# Patient Record
Sex: Male | Born: 1950 | Race: White | Hispanic: No | Marital: Single | Smoking: Current every day smoker
Health system: Southern US, Community
[De-identification: ages and names within clinical notes are randomized; demographics above are authoritative.]

## PROBLEM LIST (undated history)

## (undated) DIAGNOSIS — R42 Dizziness and giddiness: Secondary | ICD-10-CM

## (undated) DIAGNOSIS — M25511 Pain in right shoulder: Secondary | ICD-10-CM

## (undated) DIAGNOSIS — G839 Paralytic syndrome, unspecified: Secondary | ICD-10-CM

## (undated) DIAGNOSIS — F329 Major depressive disorder, single episode, unspecified: Secondary | ICD-10-CM

## (undated) DIAGNOSIS — F1010 Alcohol abuse, uncomplicated: Secondary | ICD-10-CM

## (undated) DIAGNOSIS — S31119A Laceration without foreign body of abdominal wall, unspecified quadrant without penetration into peritoneal cavity, initial encounter: Secondary | ICD-10-CM

## (undated) DIAGNOSIS — I10 Essential (primary) hypertension: Secondary | ICD-10-CM

## (undated) DIAGNOSIS — F32A Depression, unspecified: Secondary | ICD-10-CM

## (undated) HISTORY — PX: HERNIA REPAIR: SHX51

---

## 2002-03-23 ENCOUNTER — Encounter: Payer: Self-pay | Admitting: Emergency Medicine

## 2002-03-23 ENCOUNTER — Emergency Department (HOSPITAL_COMMUNITY): Admission: EM | Admit: 2002-03-23 | Discharge: 2002-03-23 | Payer: Self-pay | Admitting: *Deleted

## 2002-03-24 ENCOUNTER — Emergency Department (HOSPITAL_COMMUNITY): Admission: EM | Admit: 2002-03-24 | Discharge: 2002-03-24 | Payer: Self-pay | Admitting: Emergency Medicine

## 2015-08-16 DIAGNOSIS — S42309A Unspecified fracture of shaft of humerus, unspecified arm, initial encounter for closed fracture: Secondary | ICD-10-CM | POA: Insufficient documentation

## 2015-10-17 ENCOUNTER — Emergency Department (HOSPITAL_COMMUNITY)
Admission: EM | Admit: 2015-10-17 | Discharge: 2015-10-18 | Disposition: A | Discharge status: Home / Self Care | Payer: Medicaid Other | Attending: Emergency Medicine | Admitting: Emergency Medicine

## 2015-10-17 ENCOUNTER — Encounter (HOSPITAL_COMMUNITY): Payer: Self-pay | Admitting: Emergency Medicine

## 2015-10-17 DIAGNOSIS — F4324 Adjustment disorder with disturbance of conduct: Secondary | ICD-10-CM | POA: Diagnosis present

## 2015-10-17 DIAGNOSIS — Y998 Other external cause status: Secondary | ICD-10-CM | POA: Diagnosis not present

## 2015-10-17 DIAGNOSIS — Z008 Encounter for other general examination: Secondary | ICD-10-CM | POA: Diagnosis present

## 2015-10-17 DIAGNOSIS — T421X1A Poisoning by iminostilbenes, accidental (unintentional), initial encounter: Secondary | ICD-10-CM | POA: Insufficient documentation

## 2015-10-17 DIAGNOSIS — F1210 Cannabis abuse, uncomplicated: Secondary | ICD-10-CM | POA: Insufficient documentation

## 2015-10-17 DIAGNOSIS — Y9289 Other specified places as the place of occurrence of the external cause: Secondary | ICD-10-CM | POA: Diagnosis not present

## 2015-10-17 DIAGNOSIS — M25511 Pain in right shoulder: Secondary | ICD-10-CM | POA: Diagnosis not present

## 2015-10-17 DIAGNOSIS — F1410 Cocaine abuse, uncomplicated: Secondary | ICD-10-CM | POA: Insufficient documentation

## 2015-10-17 DIAGNOSIS — F329 Major depressive disorder, single episode, unspecified: Secondary | ICD-10-CM | POA: Diagnosis not present

## 2015-10-17 DIAGNOSIS — F17200 Nicotine dependence, unspecified, uncomplicated: Secondary | ICD-10-CM | POA: Diagnosis not present

## 2015-10-17 DIAGNOSIS — Z8669 Personal history of other diseases of the nervous system and sense organs: Secondary | ICD-10-CM | POA: Diagnosis not present

## 2015-10-17 DIAGNOSIS — Y9389 Activity, other specified: Secondary | ICD-10-CM | POA: Insufficient documentation

## 2015-10-17 DIAGNOSIS — Z8781 Personal history of (healed) traumatic fracture: Secondary | ICD-10-CM | POA: Diagnosis not present

## 2015-10-17 DIAGNOSIS — F1910 Other psychoactive substance abuse, uncomplicated: Secondary | ICD-10-CM

## 2015-10-17 DIAGNOSIS — Z046 Encounter for general psychiatric examination, requested by authority: Secondary | ICD-10-CM

## 2015-10-17 HISTORY — DX: Paralytic syndrome, unspecified: G83.9

## 2015-10-17 LAB — COMPREHENSIVE METABOLIC PANEL
ALT: 14 U/L — AB (ref 17–63)
ANION GAP: 12 (ref 5–15)
AST: 23 U/L (ref 15–41)
Albumin: 3.9 g/dL (ref 3.5–5.0)
Alkaline Phosphatase: 129 U/L — ABNORMAL HIGH (ref 38–126)
BUN: 9 mg/dL (ref 6–20)
CHLORIDE: 103 mmol/L (ref 101–111)
CO2: 22 mmol/L (ref 22–32)
Calcium: 8.7 mg/dL — ABNORMAL LOW (ref 8.9–10.3)
Creatinine, Ser: 0.75 mg/dL (ref 0.61–1.24)
GFR calc non Af Amer: >60 mL/min (ref >60)
Glucose, Bld: 180 mg/dL — ABNORMAL HIGH (ref 65–99)
POTASSIUM: 3.8 mmol/L (ref 3.5–5.1)
SODIUM: 137 mmol/L (ref 135–145)
Total Bilirubin: 0.5 mg/dL (ref 0.3–1.2)
Total Protein: 6.9 g/dL (ref 6.5–8.1)

## 2015-10-17 LAB — CBC
HCT: 41.3 % (ref 39.0–52.0)
HEMOGLOBIN: 13.4 g/dL (ref 13.0–17.0)
MCH: 30 pg (ref 26.0–34.0)
MCHC: 32.4 g/dL (ref 30.0–36.0)
MCV: 92.6 fL (ref 78.0–100.0)
Platelets: 239 10*3/uL (ref 150–400)
RBC: 4.46 MIL/uL (ref 4.22–5.81)
RDW: 15.6 % — ABNORMAL HIGH (ref 11.5–15.5)
WBC: 7.2 10*3/uL (ref 4.0–10.5)

## 2015-10-17 LAB — ETHANOL: Alcohol, Ethyl (B): <5 mg/dL (ref <5)

## 2015-10-17 LAB — CBG MONITORING, ED: GLUCOSE-CAPILLARY: 160 mg/dL — AB (ref 65–99)

## 2015-10-17 LAB — ACETAMINOPHEN LEVEL

## 2015-10-17 LAB — SALICYLATE LEVEL

## 2015-10-17 MED ORDER — ONDANSETRON 8 MG PO TBDP
8.0000 mg | ORAL_TABLET | Freq: Once | ORAL | Status: AC
  Administered 2015-10-18: 8 mg via ORAL
  Filled 2015-10-17: qty 1

## 2015-10-17 NOTE — ED Notes (Addendum)
Pt BIB [facility] after being IVC'd tonight by his substance abuse counselor. Pt states he took (5)  tegretol and per IVC paperwork has been consuming alcohol in excess. Pt denies drinking alcohol. Petitioner thinks pt has possibly taken these pills in a suicide attempt. Alert and oriented. Vomiting.

## 2015-10-17 NOTE — ED Provider Notes (Signed)
CSN: [id number]     Arrival date & time 10/17/15  2129 History  By signing my name below, I, Doreatha Martin, attest that this documentation has been prepared under the direction and in the presence of [employer], PA-C. Electronically Signed: Doreatha Martin, ED Scribe. 10/17/2015. 12:13 AM.     Chief Complaint  Patient presents with  . Drug Overdose  . IVC     The history is provided by the patient. No language interpreter was used.     HPI Comments: Jesse Aguilar is a 65 y.o. male who presents to the Emergency Department IVC'd by guidance counselor due to taking 5 tablets of 200 mg tegretol tonight. Pt states that he got evicted today and took the pills instead of taking the bottle with him. Pt reports that he frequently takes 4-5 tegretol at a time. Pt initially presented to the ED with emesis and he notes this has now improved. Pt also complains of right shoulder pain s/p fracture 30 days ago. He is being followed by physical therapy for this injury. He denies SI, HI, visual or auditory hallucinations, ETOH or drug use.    Past Medical History  Diagnosis Date  . 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 No family history on file. Social History  Substance Use Topics  . Smoking status: Current Every Day Smoker -- 1.00 packs/day  . Smokeless tobacco: Not on file  . Alcohol Use: Yes      Review of Systems  Gastrointestinal: Positive for nausea. Negative for vomiting.  Musculoskeletal: Positive for arthralgias.  Neurological: Positive for dizziness.  Psychiatric/Behavioral: Negative for suicidal ideas.  All other systems reviewed and are negative.    Allergies  Codeine  Home Medications   Prior to Admission medications   Not on File   BP 150/85 mmHg  Pulse 79  Temp(Src) 97.7 F (36.5 C) (Oral)  Resp 19  SpO2 94%   Physical Exam  Constitutional: He is oriented to person, place, and time. He  appears well-developed and well-nourished. No distress.  HENT:  Head: Normocephalic and atraumatic.  Eyes: Conjunctivae and EOM are normal. No scleral icterus.  Neck: Normal range of motion.  Cardiovascular: Normal rate, regular rhythm and intact distal pulses.   Pulmonary/Chest: Effort normal. No respiratory distress. He has no wheezes. He has no rales.  Musculoskeletal: Normal range of motion.  Neurological: He is alert and oriented to person, place, and time. He exhibits normal muscle tone. Coordination normal.  Skin: Skin is warm and dry. No rash noted. He is not diaphoretic. No erythema. No pallor.  Psychiatric: His speech is slurred (mild). He is slowed. He exhibits a depressed mood. He expresses no homicidal and no suicidal ideation. He expresses no suicidal plans and no homicidal plans.  Nursing note and vitals reviewed.   ED Course  Procedures (including critical care time)  DIAGNOSTIC STUDIES: Oxygen Saturation is 97% on RA, normal by my interpretation.    COORDINATION OF CARE: 12:08 AM Discussed treatment plan with pt at bedside and pt agreed to plan.    Labs Review Labs Reviewed  COMPREHENSIVE METABOLIC PANEL - Abnormal; Notable for the following:    Glucose, Bld 180 (*)    Calcium 8.7 (*)    ALT 14 (*)    Alkaline Phosphatase 129 (*)    All other components within normal limits  ACETAMINOPHEN LEVEL - Abnormal; Notable for the  following:    Acetaminophen (Tylenol), Serum <10 (*)    All other components within normal limits  CBC - Abnormal; Notable for the following:    RDW 15.6 (*)    All other components within normal limits  CBG MONITORING, ED - Abnormal; Notable for the following:    Glucose-Capillary 160 (*)    All other components within normal limits  ETHANOL  SALICYLATE LEVEL  URINE RAPID DRUG SCREEN, HOSP PERFORMED    Imaging Review No results found. I have personally reviewed and evaluated these images and lab results as part of my medical  decision-making.   EKG Interpretation   Date/Time:  Monday October 17 2015 22:08:46 EST Ventricular Rate:  73 PR Interval:  172 QRS Duration: 96 QT Interval:  420 QTC Calculation: 463 R Axis:   83 Text Interpretation:  Sinus rhythm Consider left atrial enlargement  Borderline right axis deviation No previous tracing Confirmed by POLLINA   MD, CHRISTOPHER [phone]) on 10/18/2015 4:13:31 AM      MDM   Final diagnoses:  Polysubstance abuse  Involuntary commitment    Patient presents to the emergency department under IVC taken out by counselor. He reports taking 5 tablets of 200 mg Tegretol in an effort to get high. He denies suicidal or homicidal ideations. Patient has been medically cleared. He is pending AM psychiatric evaluation. Disposition to be determined by oncoming ED provider.  I personally performed the services described in this documentation, which was scribed in my presence. The recorded information has been reviewed and is accurate.    Filed Vitals:   10/17/15 2138 10/18/15 0422  BP: 148/83 150/85  Pulse: 77 79  Temp: 97.4 F (36.3 C) 97.7 F (36.5 C)  TempSrc: Oral Oral  Resp:  19  SpO2: 97% 94%     Antony Madura, PA-C 10/18/15 0441  Gilda Crease, MD 10/18/15 [phone]

## 2015-10-18 DIAGNOSIS — F4324 Adjustment disorder with disturbance of conduct: Secondary | ICD-10-CM | POA: Diagnosis present

## 2015-10-18 LAB — RAPID URINE DRUG SCREEN, HOSP PERFORMED
AMPHETAMINES: NOT DETECTED
Barbiturates: NOT DETECTED
Benzodiazepines: NOT DETECTED
Cocaine: POSITIVE — AB
Opiates: NOT DETECTED
TETRAHYDROCANNABINOL: POSITIVE — AB

## 2015-10-18 NOTE — ED Notes (Signed)
Jesse Aguilar. TTS in for evaluation  

## 2015-10-18 NOTE — [facility] Suicide Risk Assessment (Signed)
Suicide Risk Assessment  Discharge Assessment   [facility] Discharge Suicide Risk Assessment   Principal Problem: Adjustment disorder with disturbance of conduct Discharge Diagnoses:  Patient Active Problem List   Diagnosis Date Noted  . 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   HENT: Negative.   Eyes: Negative.   Respiratory: Negative.   Cardiovascular: Negative.   Gastrointestinal: Negative.   Genitourinary: Negative.   Musculoskeletal: Negative.   Skin: Negative.   Neurological: Negative.   Endo/Heme/Allergies: Negative.   Psychiatric/Behavioral: Negative.     Blood pressure 132/74, pulse 98, temperature 97.7 F (36.5 C), temperature source Oral, resp. rate 18, SpO2 96 %.There is no height or weight on file to calculate BMI.  General Appearance: Disheveled  Eye Contact::  Good  Speech:  Normal Rate  Volume:  Normal  Mood:  Euthymic  Affect:  Congruent  Thought Process:  Coherent  Orientation:  Full (Time, Place, and Person)  Thought Content:  WDL  Suicidal Thoughts:  No  Homicidal Thoughts:  No  Memory:  Immediate;   Good Recent;   Good Remote;   Good  Judgement:  Fair  Insight:  Fair  Psychomotor Activity:  Normal  Concentration:  Good  Recall:  Good  Fund of Knowledge:Good  Language: Good  Akathisia:  No  Handed:  Right  AIMS (if indicated):     Assets:  Health and safety inspector Housing Leisure Time Resilience Social Support  ADL's:  Intact  Cognition: WNL  Sleep:       Mental Status Per Nursing Assessment::   On Admission:   euthymic  Demographic Factors:  Male and Caucasian  Loss Factors: NA  Historical Factors: NA  Risk Reduction Factors:   Positive social support and  Positive therapeutic relationship  Continued Clinical Symptoms:  None  Cognitive Features That Contribute To Risk:  None    Suicide Risk:  Minimal: No identifiable suicidal ideation.  Patients presenting with no risk factors but with morbid ruminations; may be classified as minimal risk based on the severity of the depressive symptoms    Plan Of Care/Follow-up recommendations:  Activity:  as tolerated Diet:  heart healthy diet  Jesse Grandberry, NP 10/18/2015, 11:26 AM

## 2015-10-18 NOTE — BH Assessment (Signed)
Tele Assessment Note   Jesse Aguilar is an 65 y.o. male.  -Clinician reviewed note by Antony Madura, PA.  Patient was evicted today.  Patient took five  tegretol.  Patient is denying A/V hallucinations, HI or SI.  Patient's PO or his anger management therapist told him they were worried about him but he refused to come to the hospital as they recommended.  They took out IVC papers and he was brought to [facility].  Patient denies wanting to kill himself today.  He states, "I was just trying to get high."  He admits to taking the tegretol.  Pt has had two previous suicide attempts by overdosing in the past.  Patient denies current intention or plan to harm self.  He is not enthused about having to go to a shelter again but will do it he says.  Patient denies any HI or A/V hallucinations.  Patient uses cocaine, ETOH and marijuana.  He reports using them 1-2 times in a week with yeserday (02/27) being the last time he used them.    Patient was released from jail in October 2016.  He was in this house for the last month or so and is now being evicted.  Patient says he was upset about this.  He otherwise denies any ongoing depression or anxiety.    Pt says he has had about five incidents of vertigo which has caused him to go to [facility] for head stitches.  He broke his right arm in a fall about 41 days ago.  He has difficulty with mobility and uses a w/c.  He can bath, dress, groom himself and neets little assistance getting into and out of wheelchair.  -Clinician discussed patient care with Donell Sievert, PA.  He recommends patient be seen by psychiatry to uphold or rescind IVC.  Diagnosis: ETOH use d/o moderate/ cocaine abuse d/o moderate, THC use d/o moderate  Past Medical History:  Past Medical History  Diagnosis Date  . 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 uses wheelchair    No past surgical history on file.  Family History: No family history on file.  Social History:   reports that he has been smoking.  He does not have any smokeless tobacco history on file. He reports that he drinks alcohol. His drug history is not on file.  Additional Social History:  Alcohol / Drug Use Prescriptions: See PTA medication list Over the Counter: None History of alcohol / drug use?: Yes Substance #1 Name of Substance 1: Marijuana 1 - Age of First Use: Teens 1 - Amount (size/oz): Two blunts in a day 1 - Frequency: 1-2 times in a week 1 - Duration: On-going 1 - Last Use / Amount: 02/27 Substance #2 Name of Substance 2: ETOH 2 - Age of First Use: 65 years of age 77 - Amount (size/oz): 6 pack at a time 2 - Frequency: 1-2 times per week 2 - Duration: On-going 2 - Last Use / Amount: 02/27 Substance #3 Name of Substance 3: Cocaine 3 - Age of First Use: 65 years of age 72 - Amount (size/oz): 1-2 lines 3 - Frequency: 1-2 times per week 3 - Duration: On-going 3 - Last Use / Amount: 02/27  CIWA: CIWA-Ar BP: 148/83 mmHg Pulse Rate: 77 COWS:    PATIENT STRENGTHS: (choose at least two) Ability for insight Average or above average intelligence Capable of independent living Communication skills  Allergies:  Allergies  Allergen Reactions  .  Codeine     Home Medications:  (Not in a hospital admission)  OB/GYN Status:  No LMP for male patient.  General Assessment Data Location of Assessment: [facility] ED TTS Assessment: In system Is this a Tele or Face-to-Face Assessment?: Face-to-Face Is this an Initial Assessment or a Re-assessment for this encounter?: Initial Assessment Marital status: Single Is patient pregnant?: No Pregnancy Status: No Living Arrangements: Non-relatives/Friends (Had a friend living w/ him but PO said friend had to go.) Can pt return to current living arrangement?: Yes Admission Status: Involuntary Is patient capable of signing voluntary admission?: No Referral Source: Other ([facility] anger management  counselor) Insurance type: MCD     Crisis Care Plan Living Arrangements: Non-relatives/Friends (Had a friend living w/ him but PO said friend had to go.) Name of Psychiatrist: None Name of Therapist: [facility]  Education Status Is patient currently in school?: No Highest grade of school patient has completed: 9th grade  Risk to self with the past 6 months Suicidal Ideation: No Has patient been a risk to self within the past 6 months prior to admission? : No Suicidal Intent: No Has patient had any suicidal intent within the past 6 months prior to admission? : No Is patient at risk for suicide?: Yes (Pt denies this was suicide attempt.) Suicidal Plan?: No Has patient had any suicidal plan within the past 6 months prior to admission? : No Access to Means: Yes Specify Access to Suicidal Means: Medications What has been your use of drugs/alcohol within the last 12 months?: ETOH, THC, cocaine Previous Attempts/Gestures: Yes How many times?: 2 Other Self Harm Risks: None Triggers for Past Attempts: Unknown Intentional Self Injurious Behavior: None Family Suicide History: No Recent stressful life event(s): Turmoil (Comment) (Informed yesterday (02/27) that he was getting evicted.) Persecutory voices/beliefs?: No Depression: No Depression Symptoms:  (Pt denies depressive symptoms.) Substance abuse history and/or treatment for substance abuse?: Yes Suicide prevention information given to non-admitted patients: Not applicable  Risk to Others within the past 6 months Homicidal Ideation: No Does patient have any lifetime risk of violence toward others beyond the six months prior to admission? : No Thoughts of Harm to Others: No Current Homicidal Intent: No Current Homicidal Plan: No Access to Homicidal Means: No Identified Victim: None History of harm to others?: Yes Assessment of Violence: In past 6-12 months Violent Behavior Description: 7 months ago in  prison Does patient have access to weapons?: No Criminal Charges Pending?: No Does patient have a court date: No Is patient on probation?: Yes (PO Victorino Dike)  Psychosis Hallucinations: None noted Delusions: None noted  Mental Status Report Appearance/Hygiene: Unremarkable, In scrubs Eye Contact: Fair Motor Activity: Freedom of movement, Unsteady Speech: Logical/coherent Level of Consciousness: Drowsy Mood: Helpless, Sad Affect: Depressed, Sad Anxiety Level: Minimal Thought Processes: Coherent, Relevant Judgement: Impaired Orientation: Person, Place, Time, Situation Obsessive Compulsive Thoughts/Behaviors: None  Cognitive Functioning Concentration: Decreased Memory: Recent Impaired, Remote Intact IQ: Average Insight: Fair Impulse Control: Poor Appetite: Good Weight Loss: 0 Weight Gain: 0 Sleep: No Change Total Hours of Sleep: 8 Vegetative Symptoms: None  ADLScreening [facility] Assessment Services) Patient's cognitive ability adequate to safely complete daily activities?: Yes Patient able to express need for assistance with ADLs?: Yes Independently performs ADLs?: Yes (appropriate for developmental age)  Prior Inpatient Therapy Prior Inpatient Therapy: No Prior Therapy Dates: None Prior Therapy Facilty/Provider(s): None Reason for Treatment: N/A  Prior Outpatient Therapy Prior Outpatient Therapy: Yes Prior Therapy Dates: October '16 to present Prior  Therapy Facilty/Provider(s): [facility] Reason for Treatment: anger management Does patient have an ACCT team?: No Does patient have Intensive In-House Services?  : No Does patient have [facility] services? : No Does patient have P4[facility] services?: No  ADL Screening (condition at time of admission) Patient's cognitive ability adequate to safely complete daily activities?: Yes Is the patient deaf or have difficulty hearing?: No Does the patient have difficulty seeing, even when wearing glasses/contacts?:  No Does the patient have difficulty concentrating, remembering, or making decisions?: No Patient able to express need for assistance with ADLs?: Yes Does the patient have difficulty dressing or bathing?: Yes Independently performs ADLs?: Yes (appropriate for developmental age) Does the patient have difficulty walking or climbing stairs?: Yes Weakness of Legs: Both (Pt uses w/c) Weakness of Arms/Hands: Right (Right arm broken 41 days ago.)  Home Assistive Devices/Equipment Home Assistive Devices/Equipment: Wheelchair    Abuse/Neglect Assessment (Assessment to be complete while patient is alone) Physical Abuse: Denies Verbal Abuse: Denies Sexual Abuse: Denies Exploitation of patient/patient's resources: Denies Self-Neglect: Denies     Merchant navy officer (For Healthcare) Does patient have an advance directive?: No Would patient like information on creating an advanced directive?: No - patient declined information    Additional Information 1:1 In Past 12 Months?: No CIRT Risk: No Elopement Risk: No Does patient have medical clearance?: Yes     Disposition:  Disposition Initial Assessment Completed for this Encounter: Yes Disposition of Patient: Other dispositions Other disposition(s): Other (Comment) (To be reviewed with PA)  Beatriz Stallion Ray 10/18/2015 3:50 AM

## 2015-10-18 NOTE — Consult Note (Signed)
[facility] Face-to-Face Psychiatry Consult   Reason for Consult:  Intentional or non-intentional overdose Referring Physician:  EDP Patient Identification: Jesse Aguilar MRN:  [id number] Principal Diagnosis: Adjustment disorder with disturbance of conduct Diagnosis:   Patient Active Problem List   Diagnosis Date Noted  . Adjustment disorder with disturbance of conduct [F43.24] 10/18/2015    Priority: High    Total Time spent with patient: 45 minutes  Subjective:   Jesse Aguilar is a 65 y.o. male patient does not warrant admission.  HPI:  65 yo male who was brought to the ED after he took 5-200 mg Tegretols, history of substance abuse.  Jesse Aguilar denies this was a suicide attempt but his attempt to get "high".  He denies depression, suicidal/homicidal ideations, hallucinations, and alcohol/drug issues.  He reports drinking about a six pack of beer a week.  He has been served a notice to leave his apartment as he had been allowing people to stay there which is against the lease agreement.  Jesse Aguilar is not concerned as he reports he will just move to another apartment.  He is already established at [facility] for care services after 41 years in prison.  Past Psychiatric History: Substance abuse  Risk to Self: Suicidal Ideation: No Suicidal Intent: No Is patient at risk for suicide?: Yes (Pt denies this was suicide attempt.) Suicidal Plan?: No Access to Means: Yes Specify Access to Suicidal Means: Medications What has been your use of drugs/alcohol within the last 12 months?: ETOH, THC, cocaine How many times?: 2 Other Self Harm Risks: None Triggers for Past Attempts: Unknown Intentional Self Injurious Behavior: None Risk to Others: Homicidal Ideation: No Thoughts of Harm to Others: No Current Homicidal Intent: No Current Homicidal Plan: No Access to Homicidal Means: No Identified Victim: None History of harm to others?: Yes Assessment of Violence: In past 6-12 months Violent Behavior  Description: 7 months ago in prison Does patient have access to weapons?: No Criminal Charges Pending?: No Does patient have a court date: No Prior Inpatient Therapy: Prior Inpatient Therapy: No Prior Therapy Dates: None Prior Therapy Facilty/Provider(s): None Reason for Treatment: N/A Prior Outpatient Therapy: Prior Outpatient Therapy: Yes Prior Therapy Dates: October '16 to present Prior Therapy Facilty/Provider(s): [facility] Reason for Treatment: anger management Does patient have an ACCT team?: No Does patient have Intensive In-House Services?  : No Does patient have [facility] services? : No Does patient have P4[facility] services?: No  Past Medical History:  Past Medical History  Diagnosis Date  . 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 Family History: No family history on file. Family Psychiatric  History: Unknown Social History:  History  Alcohol Use  . Yes     History  Drug Use Not on file    Social History   Social History  . Marital Status: Unknown    Spouse Name: N/A  . Number of Children: N/A  . Years of Education: N/A   Social History Main Topics  . Smoking status: Current Every Day Smoker -- 1.00 packs/day  . Smokeless tobacco: Not on file  . Alcohol Use: Yes  . Drug Use: Not on file  . Sexual Activity: Not on file   Other Topics Concern  . Not on file   Social History Narrative  . No narrative on file   Additional Social History:    Allergies:   Allergies  Allergen  Reactions  . Codeine     Labs:  Results for orders placed or performed during the hospital encounter of 10/17/15 (from the past 48 hour(s))  CBG monitoring, ED     Status: Abnormal   Collection Time: 10/17/15 10:02 PM  Result Value Ref Range   Glucose-Capillary 160 (H) 65 - 99 mg/dL   Comment 1 Notify RN    Comment 2 Document in Chart   Comprehensive metabolic panel     Status:  Abnormal   Collection Time: 10/17/15 10:21 PM  Result Value Ref Range   Sodium 137 135 - 145 mmol/L   Potassium 3.8 3.5 - 5.1 mmol/L   Chloride 103 101 - 111 mmol/L   CO2 22 22 - 32 mmol/L   Glucose, Bld 180 (H) 65 - 99 mg/dL   BUN 9 6 - 20 mg/dL   Creatinine, Ser 0.75 0.61 - 1.24 mg/dL   Calcium 8.7 (L) 8.9 - 10.3 mg/dL   Total Protein 6.9 6.5 - 8.1 g/dL   Albumin 3.9 3.5 - 5.0 g/dL   AST 23 15 - 41 U/L   ALT 14 (L) 17 - 63 U/L   Alkaline Phosphatase 129 (H) 38 - 126 U/L   Total Bilirubin 0.5 0.3 - 1.2 mg/dL   GFR calc non Af Amer >60 >60 mL/min   GFR calc Af Amer >60 >60 mL/min    Comment: (NOTE) The eGFR has been calculated using the CKD EPI equation. This calculation has not been validated in all clinical situations. eGFR's persistently <60 mL/min signify possible Chronic Kidney Disease.    Anion gap 12 5 - 15  Ethanol (ETOH)     Status: None   Collection Time: 10/17/15 10:21 PM  Result Value Ref Range   Alcohol, Ethyl (B) <5 <5 mg/dL    Comment:        LOWEST DETECTABLE LIMIT FOR SERUM ALCOHOL IS 5 mg/dL FOR MEDICAL PURPOSES ONLY   Salicylate level     Status: None   Collection Time: 10/17/15 10:21 PM  Result Value Ref Range   Salicylate Lvl <1.7 2.8 - 30.0 mg/dL  Acetaminophen level     Status: Abnormal   Collection Time: 10/17/15 10:21 PM  Result Value Ref Range   Acetaminophen (Tylenol), Serum <10 (L) 10 - 30 ug/mL    Comment:        THERAPEUTIC CONCENTRATIONS VARY SIGNIFICANTLY. A RANGE OF 10-30 ug/mL MAY BE AN EFFECTIVE CONCENTRATION FOR MANY PATIENTS. HOWEVER, SOME ARE BEST TREATED AT CONCENTRATIONS OUTSIDE THIS RANGE. ACETAMINOPHEN CONCENTRATIONS >150 ug/mL AT 4 HOURS AFTER INGESTION AND >50 ug/mL AT 12 HOURS AFTER INGESTION ARE OFTEN ASSOCIATED WITH TOXIC REACTIONS.   CBC     Status: Abnormal   Collection Time: 10/17/15 10:21 PM  Result Value Ref Range   WBC 7.2 4.0 - 10.5 K/uL   RBC 4.46 4.22 - 5.81 MIL/uL   Hemoglobin 13.4 13.0 - 17.0 g/dL    HCT 41.3 39.0 - 52.0 %   MCV 92.6 78.0 - 100.0 fL   MCH 30.0 26.0 - 34.0 pg   MCHC 32.4 30.0 - 36.0 g/dL   RDW 15.6 (H) 11.5 - 15.5 %   Platelets 239 150 - 400 K/uL  Urine rapid drug screen (hosp performed) (Not at [facility])     Status: Abnormal   Collection Time: 10/18/15  4:18 AM  Result Value Ref Range   Opiates NONE DETECTED NONE DETECTED   Cocaine POSITIVE (A) NONE DETECTED   Benzodiazepines NONE DETECTED NONE  DETECTED   Amphetamines NONE DETECTED NONE DETECTED   Tetrahydrocannabinol POSITIVE (A) NONE DETECTED   Barbiturates NONE DETECTED NONE DETECTED    Comment:        DRUG SCREEN FOR MEDICAL PURPOSES ONLY.  IF CONFIRMATION IS NEEDED FOR ANY PURPOSE, NOTIFY LAB WITHIN 5 DAYS.        LOWEST DETECTABLE LIMITS FOR URINE DRUG SCREEN Drug Class       Cutoff (ng/mL) Amphetamine      1000 Barbiturate      200 Benzodiazepine   707 Tricyclics       867 Opiates          300 Cocaine          300 THC              50     No current facility-administered medications for this encounter.   No current outpatient prescriptions on file.    Musculoskeletal: Strength & Muscle Tone: hemiplegic Gait & Station: unsteady Patient leans: N/A  Psychiatric Specialty Exam: Review of Systems  Constitutional: Negative.   HENT: Negative.   Eyes: Negative.   Respiratory: Negative.   Cardiovascular: Negative.   Gastrointestinal: Negative.   Genitourinary: Negative.   Musculoskeletal: Negative.   Skin: Negative.   Neurological: Negative.   Endo/Heme/Allergies: Negative.   Psychiatric/Behavioral: Negative.     Blood pressure 132/74, pulse 98, temperature 97.7 F (36.5 C), temperature source Oral, resp. rate 18, SpO2 96 %.There is no height or weight on file to calculate BMI.  General Appearance: Disheveled  Eye Contact::  Good  Speech:  Normal Rate  Volume:  Normal  Mood:  Euthymic  Affect:  Congruent  Thought Process:  Coherent  Orientation:  Full (Time, Place, and Person)   Thought Content:  WDL  Suicidal Thoughts:  No  Homicidal Thoughts:  No  Memory:  Immediate;   Good Recent;   Good Remote;   Good  Judgement:  Fair  Insight:  Fair  Psychomotor Activity:  Normal  Concentration:  Good  Recall:  Good  Fund of Knowledge:Good  Language: Good  Akathisia:  No  Handed:  Right  AIMS (if indicated):     Assets:  Catering manager Housing Leisure Time Resilience Social Support  ADL's:  Intact  Cognition: WNL  Sleep:      Treatment Plan Summary: Daily contact with patient to assess and evaluate symptoms and progress in treatment, Medication management and Plan adjustment disorder with disturbance of conduct:  -Crisis stabilization -Individual and substance abuse counseling -Medication management:  Home medical medications continued.  Disposition: No evidence of imminent risk to self or others at present.    Waylan Boga, NP 10/18/2015 11:21 AM

## 2015-10-18 NOTE — ED Notes (Signed)
Pt moved to room for comfort

## 2015-10-18 NOTE — ED Notes (Signed)
Bed: [facility] Expected date:  Expected time:  Means of arrival:  Comments: [facility] (may move to 25)

## 2015-10-18 NOTE — ED Notes (Signed)
Psych team at patients bedside.

## 2015-11-05 ENCOUNTER — Emergency Department (HOSPITAL_COMMUNITY): Payer: Medicaid Other

## 2015-11-05 ENCOUNTER — Emergency Department (HOSPITAL_COMMUNITY)
Admission: EM | Admit: 2015-11-05 | Discharge: 2015-11-06 | Disposition: A | Discharge status: Home / Self Care | Payer: Medicaid Other | Attending: Emergency Medicine | Admitting: Emergency Medicine

## 2015-11-05 ENCOUNTER — Encounter (HOSPITAL_COMMUNITY): Payer: Self-pay | Admitting: Emergency Medicine

## 2015-11-05 DIAGNOSIS — S92401A Displaced unspecified fracture of right great toe, initial encounter for closed fracture: Secondary | ICD-10-CM

## 2015-11-05 DIAGNOSIS — S90411A Abrasion, right great toe, initial encounter: Secondary | ICD-10-CM | POA: Diagnosis not present

## 2015-11-05 DIAGNOSIS — W1839XA Other fall on same level, initial encounter: Secondary | ICD-10-CM | POA: Insufficient documentation

## 2015-11-05 DIAGNOSIS — R339 Retention of urine, unspecified: Secondary | ICD-10-CM | POA: Insufficient documentation

## 2015-11-05 DIAGNOSIS — R300 Dysuria: Secondary | ICD-10-CM | POA: Diagnosis present

## 2015-11-05 DIAGNOSIS — R42 Dizziness and giddiness: Secondary | ICD-10-CM

## 2015-11-05 DIAGNOSIS — Y998 Other external cause status: Secondary | ICD-10-CM | POA: Insufficient documentation

## 2015-11-05 DIAGNOSIS — Y9389 Activity, other specified: Secondary | ICD-10-CM | POA: Diagnosis not present

## 2015-11-05 DIAGNOSIS — S9031XA Contusion of right foot, initial encounter: Secondary | ICD-10-CM | POA: Insufficient documentation

## 2015-11-05 DIAGNOSIS — R109 Unspecified abdominal pain: Secondary | ICD-10-CM | POA: Insufficient documentation

## 2015-11-05 DIAGNOSIS — R110 Nausea: Secondary | ICD-10-CM | POA: Diagnosis not present

## 2015-11-05 DIAGNOSIS — Z8669 Personal history of other diseases of the nervous system and sense organs: Secondary | ICD-10-CM | POA: Insufficient documentation

## 2015-11-05 DIAGNOSIS — Z8659 Personal history of other mental and behavioral disorders: Secondary | ICD-10-CM | POA: Diagnosis not present

## 2015-11-05 DIAGNOSIS — S92414A Nondisplaced fracture of proximal phalanx of right great toe, initial encounter for closed fracture: Secondary | ICD-10-CM | POA: Diagnosis not present

## 2015-11-05 DIAGNOSIS — F17200 Nicotine dependence, unspecified, uncomplicated: Secondary | ICD-10-CM | POA: Insufficient documentation

## 2015-11-05 DIAGNOSIS — Y9289 Other specified places as the place of occurrence of the external cause: Secondary | ICD-10-CM | POA: Insufficient documentation

## 2015-11-05 DIAGNOSIS — S01112A Laceration without foreign body of left eyelid and periocular area, initial encounter: Secondary | ICD-10-CM | POA: Insufficient documentation

## 2015-11-05 HISTORY — DX: Depression, unspecified: F32.A

## 2015-11-05 HISTORY — DX: Major depressive disorder, single episode, unspecified: F32.9

## 2015-11-05 LAB — CBC
HCT: 44.2 % (ref 39.0–52.0)
HEMOGLOBIN: 15.3 g/dL (ref 13.0–17.0)
MCH: 30.6 pg (ref 26.0–34.0)
MCHC: 34.6 g/dL (ref 30.0–36.0)
MCV: 88.4 fL (ref 78.0–100.0)
PLATELETS: 279 10*3/uL (ref 150–400)
RBC: 5 MIL/uL (ref 4.22–5.81)
RDW: 15.5 % (ref 11.5–15.5)
WBC: 15.1 10*3/uL — ABNORMAL HIGH (ref 4.0–10.5)

## 2015-11-05 LAB — URINALYSIS, ROUTINE W REFLEX MICROSCOPIC
BILIRUBIN URINE: NEGATIVE
Glucose, UA: NEGATIVE mg/dL
HGB URINE DIPSTICK: NEGATIVE
KETONES UR: NEGATIVE mg/dL
Leukocytes, UA: NEGATIVE
NITRITE: NEGATIVE
PROTEIN: NEGATIVE mg/dL
SPECIFIC GRAVITY, URINE: 1.017 (ref 1.005–1.030)
pH: 7 (ref 5.0–8.0)

## 2015-11-05 LAB — COMPREHENSIVE METABOLIC PANEL
ALBUMIN: 4.3 g/dL (ref 3.5–5.0)
ALK PHOS: 130 U/L — AB (ref 38–126)
ALT: 18 U/L (ref 17–63)
ANION GAP: 13 (ref 5–15)
AST: 26 U/L (ref 15–41)
BILIRUBIN TOTAL: 0.7 mg/dL (ref 0.3–1.2)
BUN: 15 mg/dL (ref 6–20)
CALCIUM: 9.4 mg/dL (ref 8.9–10.3)
CO2: 23 mmol/L (ref 22–32)
CREATININE: 0.83 mg/dL (ref 0.61–1.24)
Chloride: 102 mmol/L (ref 101–111)
GFR calc non Af Amer: >60 mL/min (ref >60)
GLUCOSE: 144 mg/dL — AB (ref 65–99)
Potassium: 3.7 mmol/L (ref 3.5–5.1)
SODIUM: 138 mmol/L (ref 135–145)
TOTAL PROTEIN: 8 g/dL (ref 6.5–8.1)

## 2015-11-05 LAB — LIPASE, BLOOD: Lipase: 18 U/L (ref 11–51)

## 2015-11-05 MED ORDER — IOHEXOL 300 MG/ML  SOLN
100.0000 mL | Freq: Once | INTRAMUSCULAR | Status: AC | PRN
  Administered 2015-11-06: 100 mL via INTRAVENOUS

## 2015-11-05 MED ORDER — SODIUM CHLORIDE 0.9 % IV BOLUS (SEPSIS)
1000.0000 mL | Freq: Once | INTRAVENOUS | Status: AC
  Administered 2015-11-05: 1000 mL via INTRAVENOUS

## 2015-11-05 MED ORDER — ONDANSETRON HCL 4 MG/2ML IJ SOLN
4.0000 mg | Freq: Once | INTRAMUSCULAR | Status: AC
  Administered 2015-11-05: 4 mg via INTRAVENOUS
  Filled 2015-11-05: qty 2

## 2015-11-05 MED ORDER — MORPHINE SULFATE (PF) 4 MG/ML IV SOLN
4.0000 mg | Freq: Once | INTRAVENOUS | Status: AC
  Administered 2015-11-05: 4 mg via INTRAVENOUS
  Filled 2015-11-05: qty 1

## 2015-11-05 NOTE — ED Provider Notes (Signed)
CSN: [id number]     Arrival date & time 11/05/15  1939 History  By signing my name below, I, Bethel BornBritney McCollum, attest that this documentation has been prepared under the direction and in the presence of Geoffery Lyonsouglas Itali Mckendry, MD. Electronically Signed: Bethel BornBritney McCollum, ED Scribe. 11/05/2015. 11:28 PM    Chief Complaint  Patient presents with  . Dysuria  . Abdominal Pain    The history is provided by the patient. No language interpreter was used.   Jesse Aguilar is a 65 y.o. male with history of paralysis who presents to the Emergency Department complaining of recurrent room spinning dizziness with onset 1 day ago. Pt states that the dizziness caused him to fall and lose consciousness for 10 minutes.  He was recently released from prison after 42 years and states that he had several similar episodes while incarcerated and that he was evaluated for the dizziness but told that nothing could be done.  Associated symptoms include a laceration and bruising at the right foot and bruising at the arms, knees, and face. He also complains of burning abdominal pain, nausea, more than 10 episodes of brown emesis today, and urinary incontinence.  Pt denies diarrhea.   Past Medical History  Diagnosis Date  . 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 Depression    History reviewed. No pertinent past surgical history. History reviewed. No pertinent family history. Social History  Substance Use Topics  . Smoking status: Current Every Day Smoker -- 1.00 packs/day  . Smokeless tobacco: None  . Alcohol Use: Yes     Comment: social    Review of Systems  10 Systems reviewed and all are negative for acute change except as noted in the HPI.    Allergies  Codeine  Home Medications   Prior to Admission medications   Not on File   BP 159/92 mmHg  Pulse 93  Temp(Src) 98.4 F (36.9 C) (Oral)  Resp 16  Ht 5\' 8"  (1.727 m)  Wt 138 lb (62.596 kg)  BMI 20.99 kg/m2   SpO2 97% Physical Exam  Constitutional: He is oriented to person, place, and time. He appears well-developed and well-nourished.  HENT:  Head: Normocephalic.  There is bruising and a small laceration to the left supraorbital ridge.   Eyes: EOM are normal. Pupils are equal, round, and reactive to light.  Neck: Normal range of motion.  Cardiovascular: Normal rate, regular rhythm, normal heart sounds and intact distal pulses.   No murmur heard. Pulmonary/Chest: Effort normal and breath sounds normal. No respiratory distress. He has no wheezes. He has no rales.  Abdominal: Soft. He exhibits no distension. There is no tenderness.  Musculoskeletal: Normal range of motion.  The right foot has swelling, ecchymosis, and a superficial laceration to the great toe and extending into the distal foot.   Neurological: He is alert and oriented to person, place, and time. No cranial nerve deficit. He exhibits normal muscle tone. Coordination normal.  Skin: Skin is warm and dry.  Psychiatric: He has a normal mood and affect. Judgment normal.  Nursing note and vitals reviewed.   ED Course  Procedures (including critical care time) DIAGNOSTIC STUDIES: Oxygen Saturation is 97% on RA,  normal by my interpretation.    COORDINATION OF CARE: 11:07 PM Discussed treatment plan which includes CT head without contrast, CT maxillofacial without contrast, CT A/P with contrast, XR of the right foot, lab work, morphine, Zofran, and IVF  with pt at bedside and pt agreed to plan.  Labs Review Labs Reviewed  COMPREHENSIVE METABOLIC PANEL - Abnormal; Notable for the following:    Glucose, Bld 144 (*)    Alkaline Phosphatase 130 (*)    All other components within normal limits  CBC - Abnormal; Notable for the following:    WBC 15.1 (*)    All other components within normal limits  URINALYSIS, ROUTINE W REFLEX MICROSCOPIC (NOT AT [facility]) - Abnormal; Notable for the following:    Color, Urine AMBER (*)    All other  components within normal limits  LIPASE, BLOOD    Imaging Review Ct Head Wo Contrast  11/06/2015  CLINICAL DATA:  Status post fall. Unconscious for 10 minutes. Concern for head or facial injury. Initial encounter. EXAM: CT HEAD WITHOUT CONTRAST CT MAXILLOFACIAL WITHOUT CONTRAST TECHNIQUE: Multidetector CT imaging of the head and maxillofacial structures were performed using the standard protocol without intravenous contrast. Multiplanar CT image reconstructions of the maxillofacial structures were also generated. COMPARISON:  None. FINDINGS: CT HEAD FINDINGS There is no evidence of acute infarction, mass lesion, or intra- or extra-axial hemorrhage on CT. Mild prominence of the ventricles suggests mild cortical volume loss. Mild periventricular white matter change likely reflects small vessel ischemic microangiopathy. The brainstem and fourth ventricle are within normal limits. The basal ganglia are unremarkable in appearance. The cerebral hemispheres demonstrate grossly normal gray-white differentiation. No mass effect or midline shift is seen. There is no evidence of fracture; mild chronic deformity is noted at the medial wall of the left orbit. Postoperative change is noted at the left frontoparietal calvarium. The visualized portions of the orbits are within normal limits. The paranasal sinuses and mastoid air cells are well-aerated. No significant soft tissue abnormalities are seen. CT MAXILLOFACIAL FINDINGS There is no evidence of acute fracture or dislocation. The maxilla and mandible appear intact. The nasal bone is unremarkable in appearance. Multiple maxillary and mandibular dental caries are noted. There is mild chronic deformity of the medial wall of the left orbit, and of the left orbital floor. The orbits are otherwise intact bilaterally. The visualized paranasal sinuses and mastoid air cells are well-aerated. No significant soft tissue abnormalities are seen. The parapharyngeal fat planes are  preserved. The nasopharynx, oropharynx and hypopharynx are unremarkable in appearance. The visualized portions of the valleculae and piriform sinuses are grossly unremarkable. The parotid and submandibular glands are within normal limits. No cervical lymphadenopathy is seen. IMPRESSION: 1. No evidence of traumatic intracranial injury or fracture. 2. No evidence of acute fracture or dislocation with regard to the maxillofacial structures. 3. Mild cortical volume loss and scattered small vessel ischemic microangiopathy. 4. Multiple maxillary and mandibular dental caries noted. Electronically Signed   By: Roanna Raider M.D.   On: 11/06/2015 00:15   Ct Abdomen Pelvis W Contrast  11/06/2015  CLINICAL DATA:  Status post fall. Patient was unconscious for 10 minutes. Leukocytosis. Vomiting. Initial encounter. EXAM: CT ABDOMEN AND PELVIS WITH CONTRAST TECHNIQUE: Multidetector CT imaging of the abdomen and pelvis was performed using the standard protocol following bolus administration of intravenous contrast. CONTRAST:  OMNIPAQUE IOHEXOL 300 MG/ML  SOLN COMPARISON:  CT of the abdomen and pelvis performed 03/20/2002 FINDINGS: The visualized lung bases are clear. The liver and spleen are unremarkable in appearance. The gallbladder is within normal limits. The pancreas and adrenal glands are unremarkable. A few small bilateral renal cysts are seen. Mild bilateral hydronephrosis is noted. No renal or ureteral stones are seen. No  perinephric stranding is appreciated. No free fluid is identified. The small bowel is unremarkable in appearance. The stomach is within normal limits. No acute vascular abnormalities are seen. Mild scattered calcification is noted along the abdominal aorta and its branches. The appendix is not definitely characterized; there is no evidence for appendicitis. The colon is grossly unremarkable in appearance. The bladder is markedly enlarged, measuring 21.1 cm in length. The prostate is borderline  enlarged, measuring 4.8 cm in transverse dimension, with scattered calcification. No inguinal lymphadenopathy is seen. No acute osseous abnormalities are identified. Prominent heterotopic bone formation is noted about the proximal left femur. A dynamic hip screw is noted on the left, incompletely imaged on this study. There is chronic compression deformity of vertebral body T11. IMPRESSION: 1. Markedly enlarged bladder, measuring 21.1 cm in length. Would correlate with the patient's symptoms, and consider Foley catheter placement as deemed clinically appropriate. 2. Small bilateral renal cysts noted. Mild bilateral hydronephrosis likely reflects bladder distention. 3. Mild scattered calcification along the abdominal aorta and its branches. 4. Borderline enlarged prostate. 5. Prominent heterotopic bone formation about the proximal left femur. 6. Chronic compression deformity of vertebral body T11. These results were called by telephone at the time of interpretation on 11/06/2015 at 12:17 am to Dr. Geoffery Lyons, who verbally acknowledged these results. Electronically Signed   By: Roanna Raider M.D.   On: 11/06/2015 00:33   Dg Foot Complete Right  11/06/2015  CLINICAL DATA:  Larey Seat yesterday at home after an episode of vertigo. EXAM: RIGHT FOOT COMPLETE - 3+ VIEW COMPARISON:  None. FINDINGS: There is a transverse midshaft fracture of the first proximal phalanx with longitudinal component extending to the interphalangeal joint articular surface. This is nondisplaced. No radiopaque foreign body. No dislocation. Moderate arthritic changes are present at the first MTP joint. IMPRESSION: First proximal phalangeal fracture, nondisplaced. Electronically Signed   By: Ellery Plunk M.D.   On: 11/06/2015 00:01   Ct Maxillofacial Wo Cm  11/06/2015  CLINICAL DATA:  Status post fall. Unconscious for 10 minutes. Concern for head or facial injury. Initial encounter. EXAM: CT HEAD WITHOUT CONTRAST CT MAXILLOFACIAL WITHOUT  CONTRAST TECHNIQUE: Multidetector CT imaging of the head and maxillofacial structures were performed using the standard protocol without intravenous contrast. Multiplanar CT image reconstructions of the maxillofacial structures were also generated. COMPARISON:  None. FINDINGS: CT HEAD FINDINGS There is no evidence of acute infarction, mass lesion, or intra- or extra-axial hemorrhage on CT. Mild prominence of the ventricles suggests mild cortical volume loss. Mild periventricular white matter change likely reflects small vessel ischemic microangiopathy. The brainstem and fourth ventricle are within normal limits. The basal ganglia are unremarkable in appearance. The cerebral hemispheres demonstrate grossly normal gray-white differentiation. No mass effect or midline shift is seen. There is no evidence of fracture; mild chronic deformity is noted at the medial wall of the left orbit. Postoperative change is noted at the left frontoparietal calvarium. The visualized portions of the orbits are within normal limits. The paranasal sinuses and mastoid air cells are well-aerated. No significant soft tissue abnormalities are seen. CT MAXILLOFACIAL FINDINGS There is no evidence of acute fracture or dislocation. The maxilla and mandible appear intact. The nasal bone is unremarkable in appearance. Multiple maxillary and mandibular dental caries are noted. There is mild chronic deformity of the medial wall of the left orbit, and of the left orbital floor. The orbits are otherwise intact bilaterally. The visualized paranasal sinuses and mastoid air cells are well-aerated. No significant soft tissue  abnormalities are seen. The parapharyngeal fat planes are preserved. The nasopharynx, oropharynx and hypopharynx are unremarkable in appearance. The visualized portions of the valleculae and piriform sinuses are grossly unremarkable. The parotid and submandibular glands are within normal limits. No cervical lymphadenopathy is seen.  IMPRESSION: 1. No evidence of traumatic intracranial injury or fracture. 2. No evidence of acute fracture or dislocation with regard to the maxillofacial structures. 3. Mild cortical volume loss and scattered small vessel ischemic microangiopathy. 4. Multiple maxillary and mandibular dental caries noted. Electronically Signed   By: Roanna Raider M.D.   On: 11/06/2015 00:15   I have personally reviewed and evaluated these images and lab results as part of my medical decision-making.   EKG Interpretation None      MDM   Final diagnoses:  None    Patient presents with multiple complaints including abdominal discomfort, dizziness with falls, and right great toe pain. He is also having episodes of urinary incontinence. His workup today reveals a markedly distended urinary bladder which when catheterized, produced nearly 2 L of urine. I suspect that is what is causing the above symptoms. His renal function is normal. The Foley catheter will be left in place and the patient is to follow-up this week with urology. He was also found to have a broken toe from one of his falls. There is a superficial abrasion in the area of the fracture, however I do not believe this represents an open fracture.  I personally performed the services described in this documentation, which was scribed in my presence. The recorded information has been reviewed and is accurate.       Geoffery Lyons, MD 11/06/15 [phone]

## 2015-11-05 NOTE — ED Notes (Signed)
Pt states that starting about 24 hours ago he began experiencing urinary incontinence. Pt states he also frequently falls due to vertigo. Pt reports that yesterday he fell and was unconscious "for ten minutes". Pt has a cut and swelling on his right big toe from this fall.  Pt also reports having upper abdominal pain. Pt states that has been going on for about 24 hours as well. Pt states he had about 10 episodes of emesis today and has not eaten since Friday. Pt states that the [facility] told him they would set up home health, he has not heard from them.

## 2015-11-05 NOTE — ED Notes (Signed)
Pt. Called out in waiting room,pt. did not answer.

## 2015-11-06 MED ORDER — LIDOCAINE HCL 2 % EX GEL
1.0000 "application " | Freq: Once | CUTANEOUS | Status: AC
  Administered 2015-11-06: 1 via URETHRAL
  Filled 2015-11-06: qty 11

## 2015-11-06 NOTE — Discharge Instructions (Signed)
You are to follow-up with urology this week. The contact information for [facility] has been provided in this discharge summary. You are to call on Monday to arrange this appointment.  Return to the emergency department if symptoms significantly worsen or change.   Dizziness Dizziness is a common problem. It makes you feel unsteady or lightheaded. You may feel like you are about to pass out (faint). Dizziness can lead to injury if you stumble or fall. Anyone can get dizzy, but dizziness is more common in older adults. This condition can be caused by a number of things, including:  Medicines.  Dehydration.  Illness. HOME CARE Following these instructions may help with your condition: Eating and Drinking  Drink enough fluid to keep your pee (urine) clear or pale yellow. This helps to keep you from getting dehydrated. Try to drink more clear fluids, such as water.  Do not drink alcohol.  Limit how much caffeine you drink or eat if told by your doctor.  Limit how much salt you drink or eat if told by your doctor. Activity  Avoid making quick movements.  When you stand up from sitting in a chair, steady yourself until you feel okay.  In the morning, first sit up on the side of the bed. When you feel okay, stand slowly while you hold onto something. Do this until you know that your balance is fine.  Move your legs often if you need to stand in one place for a long time. Tighten and relax your muscles in your legs while you are standing.  Do not drive or use heavy machinery if you feel dizzy.  Avoid bending down if you feel dizzy. Place items in your home so that they are easy for you to reach without leaning over. Lifestyle  Do not use any tobacco products, including cigarettes, chewing tobacco, or electronic cigarettes. If you need help quitting, ask your doctor.  Try to lower your stress level, such as with yoga or meditation. Talk with your doctor if you need  help. General Instructions  Watch your dizziness for any changes.  Take medicines only as told by your doctor. Talk with your doctor if you think that your dizziness is caused by a medicine that you are taking.  Tell a friend or a family member that you are feeling dizzy. If he or she notices any changes in your behavior, have this person call your doctor.  Keep all follow-up visits as told by your doctor. This is important. GET HELP IF:  Your dizziness does not go away.  Your dizziness or light-headedness gets worse.  You feel sick to your stomach (nauseous).  You have trouble hearing.  You have new symptoms.  You are unsteady on your feet or you feel like the room is spinning. GET HELP RIGHT AWAY IF:  You throw up (vomit) or have diarrhea and are unable to eat or drink anything.  You have trouble:  Talking.  Walking.  Swallowing.  Using your arms, hands, or legs.  You feel generally weak.  You are not thinking clearly or you have trouble forming sentences. It may take a friend or family member to notice this.  You have:  Chest pain.  Pain in your belly (abdomen).  Shortness of breath.  Sweating.  Your vision changes.  You are bleeding.  You have a headache.  You have neck pain or a stiff neck.  You have a fever.   This information is not intended to replace  advice given to you by your health care provider. Make sure you discuss any questions you have with your health care provider.   Document Released: 07/26/2011 Document Revised: 12/21/2014 Document Reviewed: 08/02/2014 Elsevier Interactive Patient Education 2016 [employer]  Toe Fracture A toe fracture is a break in one of the toe bones (phalanges). CAUSES This condition may be caused by:  Dropping a heavy object on your toe.  Stubbing your toe.  Overusing your toe or doing repetitive exercise.  Twisting or stretching your toe out of place. RISK FACTORS This condition is more  likely to develop in people who:  Play contact sports.  Have a bone disease.  Have a low calcium level. SYMPTOMS The main symptoms of this condition are swelling and pain in the toe. The pain may get worse with standing or walking. Other symptoms include:  Bruising.  Stiffness.  Numbness.  A change in the way the toe looks.  Broken bones that poke through the skin.  Blood beneath the toenail. DIAGNOSIS This condition is diagnosed with a physical exam. You may also have X-rays. TREATMENT  Treatment for this condition depends on the type of fracture and its severity. Treatment may involve:  Taping the broken toe to a toe that is next to it (buddy taping). This is the most common treatment for fractures in which the bone has not moved out of place (nondisplaced fracture).  Wearing a shoe that has a wide, rigid sole to protect the toe and to limit its movement.  Wearing a walking cast.  Having a procedure to move the toe back into place.  Surgery. This may be needed:  If there are many pieces of broken bone that are out of place (displaced).  If the toe joint breaks.  If the bone breaks through the skin.  Physical therapy. This is done to help regain movement and strength in the toe. You may need follow-up X-rays to make sure that the bone is healing well and staying in position. HOME CARE INSTRUCTIONS If You Have a Cast:  Do not stick anything inside the cast to scratch your skin. Doing that increases your risk of infection.  Check the skin around the cast every day. Report any concerns to your health care provider. You may put lotion on dry skin around the edges of the cast. Do not apply lotion to the skin underneath the cast.  Do not put pressure on any part of the cast until it is fully hardened. This may take several hours.  Keep the cast clean and dry. Bathing  Do not take baths, swim, or use a hot tub until your health care provider approves. Ask your health  care provider if you can take showers. You may only be allowed to take sponge baths for bathing.  If your health care provider approves bathing and showering, cover the cast or bandage (dressing) with a watertight plastic bag to protect it from water. Do not let the cast or dressing get wet. Managing Pain, Stiffness, and Swelling  If you do not have a cast, apply ice to the injured area, if directed.  Put ice in a plastic bag.  Place a towel between your skin and the bag.  Leave the ice on for 20 minutes, 2-3 times per day.  Move your toes often to avoid stiffness and to lessen swelling.  Raise (elevate) the injured area above the level of your heart while you are sitting or lying down. Driving  Do not drive  or operate heavy machinery while taking pain medicine.  Do not drive while wearing a cast on a foot that you use for driving. Activity  Return to your normal activities as directed by your health care provider. Ask your health care provider what activities are safe for you.  Perform exercises daily as directed by your health care provider or physical therapist. Safety  Do not use the injured limb to support your body weight until your health care provider says that you can. Use crutches or other assistive devices as directed by your health care provider. General Instructions  If your toe was treated with buddy taping, follow your health care provider's instructions for changing the gauze and tape. Change it more often:  The gauze and tape get wet. If this happens, dry the space between the toes.  The gauze and tape are too tight and cause your toe to become pale or numb.  Wear a protective shoe as directed by your health care provider. If you were not given a protective shoe, wear sturdy, supportive shoes. Your shoes should not pinch your toes and should not fit tightly against your toes.  Do not use any tobacco products, including cigarettes, chewing tobacco, or  e-cigarettes. Tobacco can delay bone healing. If you need help quitting, ask your health care provider.  Take medicines only as directed by your health care provider.  Keep all follow-up visits as directed by your health care provider. This is important. SEEK MEDICAL CARE IF:  You have a fever.  Your pain medicine is not helping.  Your toe is cold.  Your toe is numb.  You still have pain after one week of rest and treatment.  You still have pain after your health care provider has said that you can start walking again.  You have pain, tingling, or numbness in your foot that is not going away. SEEK IMMEDIATE MEDICAL CARE IF:  You have severe pain.  You have redness or inflammation in your toe that is getting worse.  You have pain or numbness in your toe that is getting worse.  Your toe turns blue.   This information is not intended to replace advice given to you by your health care provider. Make sure you discuss any questions you have with your health care provider.   Document Released: 08/03/2000 Document Revised: 04/27/2015 Document Reviewed: 06/02/2014 Elsevier Interactive Patient Education [employer]

## 2015-11-11 ENCOUNTER — Encounter (HOSPITAL_COMMUNITY): Payer: Self-pay | Admitting: Nurse Practitioner

## 2015-11-11 ENCOUNTER — Emergency Department (HOSPITAL_COMMUNITY)
Admission: EM | Admit: 2015-11-11 | Discharge: 2015-11-11 | Disposition: A | Discharge status: Home / Self Care | Payer: Medicaid Other | Attending: Emergency Medicine | Admitting: Emergency Medicine

## 2015-11-11 DIAGNOSIS — R1030 Lower abdominal pain, unspecified: Secondary | ICD-10-CM | POA: Diagnosis not present

## 2015-11-11 DIAGNOSIS — M79674 Pain in right toe(s): Secondary | ICD-10-CM | POA: Diagnosis not present

## 2015-11-11 DIAGNOSIS — F17200 Nicotine dependence, unspecified, uncomplicated: Secondary | ICD-10-CM | POA: Insufficient documentation

## 2015-11-11 DIAGNOSIS — Z8659 Personal history of other mental and behavioral disorders: Secondary | ICD-10-CM | POA: Insufficient documentation

## 2015-11-11 DIAGNOSIS — Z8669 Personal history of other diseases of the nervous system and sense organs: Secondary | ICD-10-CM | POA: Diagnosis not present

## 2015-11-11 HISTORY — DX: Laceration without foreign body of abdominal wall, unspecified quadrant without penetration into peritoneal cavity, initial encounter: S31.119A

## 2015-11-11 HISTORY — DX: Pain in right shoulder: M25.511

## 2015-11-11 LAB — URINALYSIS, ROUTINE W REFLEX MICROSCOPIC
Bilirubin Urine: NEGATIVE
Glucose, UA: NEGATIVE mg/dL
KETONES UR: NEGATIVE mg/dL
LEUKOCYTES UA: NEGATIVE
NITRITE: NEGATIVE
PROTEIN: NEGATIVE mg/dL
Specific Gravity, Urine: 1.007 (ref 1.005–1.030)
pH: 7.5 (ref 5.0–8.0)

## 2015-11-11 LAB — URINE MICROSCOPIC-ADD ON
Bacteria, UA: NONE SEEN
Squamous Epithelial / LPF: NONE SEEN

## 2015-11-11 MED ORDER — TAMSULOSIN HCL 0.4 MG PO CAPS: 0.4000 mg | ORAL_CAPSULE | Freq: Every day | ORAL | Status: DC

## 2015-11-11 MED ORDER — KETOROLAC TROMETHAMINE 30 MG/ML IJ SOLN
30.0000 mg | Freq: Once | INTRAMUSCULAR | Status: AC
  Administered 2015-11-11: 30 mg via INTRAMUSCULAR
  Filled 2015-11-11: qty 1

## 2015-11-11 NOTE — ED Notes (Signed)
PT DISCHARGED. INSTRUCTIONS AND PRESCRIPTION GIVEN. AAOX3. PT IN NO APPARENT DISTRESS OR PAIN. THE OPPORTUNITY TO ASK QUESTIONS WAS PROVIDED. 

## 2015-11-11 NOTE — ED Notes (Signed)
Patient presents to [facility] via [facility] with complaints that the urinary catheter that was placed a week ago is causing pain. He has pulled on the catheter with the intent of removing it on his own overnight but states it will not come out. He did not uninflate the balloon. He continues that he has not seen Urology specialists since he was here last week as instructed to determine the causes of the urinary retention that caused the placement of the catheter in this ED. He requests that we remove the catheter at this time.

## 2015-11-11 NOTE — ED Notes (Signed)
Bed: WA06 Expected date:  Expected time:  Means of arrival:  Comments: EMS- 64yo M, groin pain/trying to pull out catheter

## 2015-11-11 NOTE — Discharge Instructions (Signed)
As discussed, it is important that you monitor your condition carefully, and if you develop new, or concerning changes, please be sure to return here. Specifically, if you are unable to urinate, he will need to return here for evaluation, possible placement of catheter.  Otherwise, please be sure to follow-up with both your urologist and orthopedist.
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
## 2016-01-03 ENCOUNTER — Encounter (HOSPITAL_COMMUNITY): Payer: Self-pay | Admitting: Emergency Medicine

## 2016-01-03 ENCOUNTER — Emergency Department (HOSPITAL_COMMUNITY)
Admission: EM | Admit: 2016-01-03 | Discharge: 2016-01-03 | Disposition: A | Discharge status: Home / Self Care | Payer: Medicaid Other | Attending: Emergency Medicine | Admitting: Emergency Medicine

## 2016-01-03 ENCOUNTER — Emergency Department (HOSPITAL_COMMUNITY): Payer: Medicaid Other

## 2016-01-03 DIAGNOSIS — S299XXA Unspecified injury of thorax, initial encounter: Secondary | ICD-10-CM | POA: Diagnosis present

## 2016-01-03 DIAGNOSIS — S2232XA Fracture of one rib, left side, initial encounter for closed fracture: Secondary | ICD-10-CM | POA: Diagnosis not present

## 2016-01-03 DIAGNOSIS — Y999 Unspecified external cause status: Secondary | ICD-10-CM | POA: Insufficient documentation

## 2016-01-03 DIAGNOSIS — Z792 Long term (current) use of antibiotics: Secondary | ICD-10-CM | POA: Insufficient documentation

## 2016-01-03 DIAGNOSIS — W19XXXA Unspecified fall, initial encounter: Secondary | ICD-10-CM | POA: Diagnosis not present

## 2016-01-03 DIAGNOSIS — F329 Major depressive disorder, single episode, unspecified: Secondary | ICD-10-CM | POA: Insufficient documentation

## 2016-01-03 DIAGNOSIS — Z7951 Long term (current) use of inhaled steroids: Secondary | ICD-10-CM | POA: Diagnosis not present

## 2016-01-03 DIAGNOSIS — Y92009 Unspecified place in unspecified non-institutional (private) residence as the place of occurrence of the external cause: Secondary | ICD-10-CM | POA: Diagnosis not present

## 2016-01-03 DIAGNOSIS — Y939 Activity, unspecified: Secondary | ICD-10-CM | POA: Diagnosis not present

## 2016-01-03 DIAGNOSIS — F17200 Nicotine dependence, unspecified, uncomplicated: Secondary | ICD-10-CM | POA: Diagnosis not present

## 2016-01-03 LAB — BASIC METABOLIC PANEL
ANION GAP: 7 (ref 5–15)
BUN: 11 mg/dL (ref 6–20)
CALCIUM: 8.8 mg/dL — AB (ref 8.9–10.3)
CO2: 24 mmol/L (ref 22–32)
Chloride: 108 mmol/L (ref 101–111)
Creatinine, Ser: 0.63 mg/dL (ref 0.61–1.24)
GFR calc non Af Amer: >60 mL/min (ref >60)
Glucose, Bld: 111 mg/dL — ABNORMAL HIGH (ref 65–99)
Potassium: 3.8 mmol/L (ref 3.5–5.1)
Sodium: 139 mmol/L (ref 135–145)

## 2016-01-03 LAB — I-STAT CHEM 8, ED
BUN: 9 mg/dL (ref 6–20)
CALCIUM ION: 1.13 mmol/L (ref 1.13–1.30)
CHLORIDE: 105 mmol/L (ref 101–111)
Creatinine, Ser: 0.6 mg/dL — ABNORMAL LOW (ref 0.61–1.24)
GLUCOSE: 102 mg/dL — AB (ref 65–99)
HCT: 45 % (ref 39.0–52.0)
Hemoglobin: 15.3 g/dL (ref 13.0–17.0)
Potassium: 3.7 mmol/L (ref 3.5–5.1)
Sodium: 142 mmol/L (ref 135–145)
TCO2: 23 mmol/L (ref 0–100)

## 2016-01-03 LAB — URINE MICROSCOPIC-ADD ON

## 2016-01-03 LAB — CBC
HEMATOCRIT: 41 % (ref 39.0–52.0)
HEMOGLOBIN: 13.8 g/dL (ref 13.0–17.0)
MCH: 31.6 pg (ref 26.0–34.0)
MCHC: 33.7 g/dL (ref 30.0–36.0)
MCV: 93.8 fL (ref 78.0–100.0)
Platelets: 302 10*3/uL (ref 150–400)
RBC: 4.37 MIL/uL (ref 4.22–5.81)
RDW: 15.2 % (ref 11.5–15.5)
WBC: 10 10*3/uL (ref 4.0–10.5)

## 2016-01-03 LAB — URINALYSIS, ROUTINE W REFLEX MICROSCOPIC
BILIRUBIN URINE: NEGATIVE
Glucose, UA: NEGATIVE mg/dL
HGB URINE DIPSTICK: NEGATIVE
KETONES UR: 40 mg/dL — AB
Nitrite: NEGATIVE
PH: 6.5 (ref 5.0–8.0)
Protein, ur: NEGATIVE mg/dL
SPECIFIC GRAVITY, URINE: 1.012 (ref 1.005–1.030)

## 2016-01-03 LAB — CBG MONITORING, ED: Glucose-Capillary: 106 mg/dL — ABNORMAL HIGH (ref 65–99)

## 2016-01-03 MED ORDER — CEPHALEXIN 500 MG PO CAPS
500.0000 mg | ORAL_CAPSULE | Freq: Once | ORAL | Status: AC
  Administered 2016-01-03: 500 mg via ORAL
  Filled 2016-01-03: qty 1

## 2016-01-03 MED ORDER — HYDROCODONE-ACETAMINOPHEN 5-325 MG PO TABS
1.0000 | ORAL_TABLET | Freq: Once | ORAL | Status: AC
  Administered 2016-01-03: 1 via ORAL
  Filled 2016-01-03: qty 1

## 2016-01-03 MED ORDER — HYDROCODONE-ACETAMINOPHEN 5-325 MG PO TABS: 1.0000 | ORAL_TABLET | Freq: Four times a day (QID) | ORAL | Status: DC | PRN

## 2016-01-03 MED ORDER — CEPHALEXIN 500 MG PO CAPS: 500.0000 mg | ORAL_CAPSULE | Freq: Three times a day (TID) | ORAL | Status: DC

## 2016-01-03 NOTE — ED Notes (Signed)
[facility] notified of transport. 
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
## 2016-01-03 NOTE — ED Notes (Signed)
Patient made aware that urine sample is needed. Urinal at bedside.  

## 2016-01-03 NOTE — ED Notes (Signed)
Patient transported to X-ray 

## 2016-01-03 NOTE — ED Notes (Signed)
Per EMS, patient became dizzy, lost balance and fell 2 hours ago. Patient is complaining of left lower rib pain. No obvious deformities. Hx of vertigo. Hx of broken ribs on same left side. Patient is from home.

## 2016-01-03 NOTE — ED Notes (Signed)
Bed: WA01 Expected date:  Expected time:  Means of arrival:  Comments: 6163 - Lightheaded/fall/rib pain

## 2016-01-03 NOTE — ED Notes (Signed)
Discharge instructions, follow up care, and rx x2 reviewed with patient. Patient verbalized understanding. 

## 2016-01-10 ENCOUNTER — Encounter (HOSPITAL_COMMUNITY): Payer: Self-pay | Admitting: Emergency Medicine

## 2016-01-10 ENCOUNTER — Emergency Department (HOSPITAL_COMMUNITY): Payer: Medicaid Other

## 2016-01-10 ENCOUNTER — Emergency Department (HOSPITAL_COMMUNITY)
Admission: EM | Admit: 2016-01-10 | Discharge: 2016-01-11 | Disposition: A | Discharge status: Home / Self Care | Payer: Medicaid Other | Attending: Emergency Medicine | Admitting: Emergency Medicine

## 2016-01-10 DIAGNOSIS — Y939 Activity, unspecified: Secondary | ICD-10-CM | POA: Diagnosis not present

## 2016-01-10 DIAGNOSIS — R0602 Shortness of breath: Secondary | ICD-10-CM | POA: Insufficient documentation

## 2016-01-10 DIAGNOSIS — Z79899 Other long term (current) drug therapy: Secondary | ICD-10-CM | POA: Diagnosis not present

## 2016-01-10 DIAGNOSIS — Y929 Unspecified place or not applicable: Secondary | ICD-10-CM | POA: Diagnosis not present

## 2016-01-10 DIAGNOSIS — R42 Dizziness and giddiness: Secondary | ICD-10-CM | POA: Diagnosis not present

## 2016-01-10 DIAGNOSIS — Y999 Unspecified external cause status: Secondary | ICD-10-CM | POA: Insufficient documentation

## 2016-01-10 DIAGNOSIS — W19XXXA Unspecified fall, initial encounter: Secondary | ICD-10-CM | POA: Diagnosis not present

## 2016-01-10 DIAGNOSIS — S20212A Contusion of left front wall of thorax, initial encounter: Secondary | ICD-10-CM | POA: Insufficient documentation

## 2016-01-10 DIAGNOSIS — S29001A Unspecified injury of muscle and tendon of front wall of thorax, initial encounter: Secondary | ICD-10-CM | POA: Diagnosis present

## 2016-01-10 DIAGNOSIS — F17200 Nicotine dependence, unspecified, uncomplicated: Secondary | ICD-10-CM | POA: Insufficient documentation
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
## 2016-01-11 ENCOUNTER — Emergency Department (HOSPITAL_COMMUNITY)
Admission: EM | Admit: 2016-01-11 | Discharge: 2016-01-11 | Disposition: A | Discharge status: Home / Self Care | Payer: Medicaid Other | Source: Home / Self Care | Attending: Emergency Medicine | Admitting: Emergency Medicine

## 2016-01-11 ENCOUNTER — Encounter (HOSPITAL_COMMUNITY): Payer: Self-pay | Admitting: Emergency Medicine

## 2016-01-11 DIAGNOSIS — F329 Major depressive disorder, single episode, unspecified: Secondary | ICD-10-CM

## 2016-01-11 DIAGNOSIS — F17200 Nicotine dependence, unspecified, uncomplicated: Secondary | ICD-10-CM

## 2016-01-11 DIAGNOSIS — Z792 Long term (current) use of antibiotics: Secondary | ICD-10-CM | POA: Insufficient documentation

## 2016-01-11 DIAGNOSIS — Z79891 Long term (current) use of opiate analgesic: Secondary | ICD-10-CM

## 2016-01-11 DIAGNOSIS — R0789 Other chest pain: Secondary | ICD-10-CM | POA: Insufficient documentation

## 2016-01-11 MED ORDER — ALBUTEROL SULFATE HFA 108 (90 BASE) MCG/ACT IN AERS: 2.0000 | INHALATION_SPRAY | RESPIRATORY_TRACT | Status: AC | PRN

## 2016-01-11 MED ORDER — OXYCODONE-ACETAMINOPHEN 5-325 MG PO TABS: 1.0000 | ORAL_TABLET | Freq: Four times a day (QID) | ORAL | Status: DC | PRN

## 2016-01-11 MED ORDER — IBUPROFEN 800 MG PO TABS
800.0000 mg | ORAL_TABLET | Freq: Once | ORAL | Status: AC
  Administered 2016-01-11: 800 mg via ORAL
  Filled 2016-01-11: qty 1

## 2016-01-11 MED ORDER — IPRATROPIUM-ALBUTEROL 0.5-2.5 (3) MG/3ML IN SOLN
3.0000 mL | Freq: Once | RESPIRATORY_TRACT | Status: AC
  Administered 2016-01-11: 3 mL via RESPIRATORY_TRACT
  Filled 2016-01-11: qty 3

## 2016-01-11 MED ORDER — OXYCODONE-ACETAMINOPHEN 5-325 MG PO TABS
1.0000 | ORAL_TABLET | Freq: Once | ORAL | Status: AC
  Administered 2016-01-11: 1 via ORAL
  Filled 2016-01-11: qty 1

## 2016-01-11 MED ORDER — IBUPROFEN 200 MG PO TABS
400.0000 mg | ORAL_TABLET | Freq: Once | ORAL | Status: AC
  Administered 2016-01-11: 400 mg via ORAL
  Filled 2016-01-11: qty 2

## 2016-01-11 NOTE — ED Notes (Signed)
Patient brought in by [facility] with complaints of pain to left rib cage. Hx of rib fracture to left side. Pain 10/10.
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
## 2016-01-11 NOTE — Discharge Instructions (Signed)

## 2016-01-11 NOTE — Discharge Instructions (Signed)
He was seen today for chest pain following a fall. You likely have some bruising over the left chest. He'll be given an incentive spirometer. Do this 3-4 times a day to help prevent pneumonia. Pain control is very important.  Chest Contusion A chest contusion is a deep bruise on your chest area. Contusions are the result of an injury that caused bleeding under the skin. A chest contusion may involve bruising of the skin, muscles, or ribs. The contusion may turn blue, purple, or yellow. Minor injuries will give you a painless contusion, but more severe contusions may stay painful and swollen for a few weeks. CAUSES  A contusion is usually caused by a blow, trauma, or direct force to an area of the body. SYMPTOMS   Swelling and redness of the injured area.  Discoloration of the injured area.  Tenderness and soreness of the injured area.  Pain. DIAGNOSIS  The diagnosis can be made by taking a history and performing a physical exam. An X-ray, CT scan, or MRI may be needed to determine if there were any associated injuries, such as broken bones (fractures) or internal injuries. TREATMENT  Often, the best treatment for a chest contusion is resting, icing, and applying cold compresses to the injured area. Deep breathing exercises may be recommended to reduce the risk of pneumonia. Over-the-counter medicines may also be recommended for pain control. HOME CARE INSTRUCTIONS   Put ice on the injured area.  Put ice in a plastic bag.  Place a towel between your skin and the bag.  Leave the ice on for 15-20 minutes, 03-04 times a day.  Only take over-the-counter or prescription medicines as directed by your caregiver. Your caregiver may recommend avoiding anti-inflammatory medicines (aspirin, ibuprofen, and naproxen) for 48 hours because these medicines may increase bruising.  Rest the injured area.  Perform deep-breathing exercises as directed by your caregiver.  Stop smoking if you  smoke.  Do not lift objects over 5 pounds (2.3 kg) for 3 days or longer if recommended by your caregiver. SEEK IMMEDIATE MEDICAL CARE IF:   You have increased bruising or swelling.  You have pain that is getting worse.  You have difficulty breathing.  You have dizziness, weakness, or fainting.  You have blood in your urine or stool.  You cough up or vomit blood.  Your swelling or pain is not relieved with medicines. MAKE SURE YOU:   Understand these instructions.  Will watch your condition.  Will get help right away if you are not doing well or get worse.   This information is not intended to replace advice given to you by your health care provider. Make sure you discuss any questions you have with your health care provider.   Document Released: 05/01/2001 Document Revised: 04/30/2012 Document Reviewed: 01/28/2012 Elsevier Interactive Patient Education [employer]2016 [employer] Inc.

## 2016-01-29 ENCOUNTER — Emergency Department (HOSPITAL_COMMUNITY)
Admission: EM | Admit: 2016-01-29 | Discharge: 2016-01-30 | Disposition: A | Discharge status: Home / Self Care | Payer: Medicaid Other | Attending: Emergency Medicine | Admitting: Emergency Medicine

## 2016-01-29 ENCOUNTER — Encounter (HOSPITAL_COMMUNITY): Payer: Self-pay | Admitting: Emergency Medicine

## 2016-01-29 DIAGNOSIS — W19XXXA Unspecified fall, initial encounter: Secondary | ICD-10-CM | POA: Diagnosis not present

## 2016-01-29 DIAGNOSIS — Z79899 Other long term (current) drug therapy: Secondary | ICD-10-CM | POA: Insufficient documentation

## 2016-01-29 DIAGNOSIS — R42 Dizziness and giddiness: Secondary | ICD-10-CM | POA: Insufficient documentation

## 2016-01-29 DIAGNOSIS — M25512 Pain in left shoulder: Secondary | ICD-10-CM | POA: Diagnosis not present

## 2016-01-29 DIAGNOSIS — Y939 Activity, unspecified: Secondary | ICD-10-CM | POA: Insufficient documentation

## 2016-01-29 DIAGNOSIS — Y929 Unspecified place or not applicable: Secondary | ICD-10-CM | POA: Insufficient documentation

## 2016-01-29 DIAGNOSIS — Y999 Unspecified external cause status: Secondary | ICD-10-CM | POA: Insufficient documentation

## 2016-01-29 DIAGNOSIS — F17200 Nicotine dependence, unspecified, uncomplicated: Secondary | ICD-10-CM | POA: Diagnosis not present

## 2016-01-29 DIAGNOSIS — F329 Major depressive disorder, single episode, unspecified: Secondary | ICD-10-CM | POA: Insufficient documentation

## 2016-01-29 NOTE — ED Notes (Signed)
Pt states he has vertigo and wants to see the Dr. Rock NephewPt states if he falls out its our fault. Pt told to stay in triage.
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
## 2016-01-30 ENCOUNTER — Emergency Department (HOSPITAL_COMMUNITY): Payer: Medicaid Other

## 2016-01-30 MED ORDER — IPRATROPIUM-ALBUTEROL 0.5-2.5 (3) MG/3ML IN SOLN
3.0000 mL | Freq: Once | RESPIRATORY_TRACT | Status: AC
  Administered 2016-01-30: 3 mL via RESPIRATORY_TRACT
  Filled 2016-01-30: qty 3

## 2016-01-30 MED ORDER — OXYCODONE-ACETAMINOPHEN 5-325 MG PO TABS: 1.0000 | ORAL_TABLET | Freq: Three times a day (TID) | ORAL | Status: DC | PRN

## 2016-01-30 MED ORDER — OXYCODONE-ACETAMINOPHEN 5-325 MG PO TABS
1.0000 | ORAL_TABLET | Freq: Once | ORAL | Status: AC
  Administered 2016-01-30: 1 via ORAL
  Filled 2016-01-30: qty 1

## 2016-01-30 NOTE — ED Notes (Signed)
Notified radiology that patient is ready for scan.
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
## 2016-01-30 NOTE — Discharge Instructions (Signed)
Please call the number listed on your discharge instructions for help finding a primary care provider. Long term pain management is something best managed by a primary care provider. Return to ER for new or worsening symptoms, any additional concerns.

## 2016-01-30 NOTE — ED Notes (Signed)
Pt called out wanting to speak with the charge nurse. Tiffany, RN (Press photographercharge nurse) at bedside. Provider has signed up to assess patient but not been in room yet. Provider in anothers patients.

## 2016-04-06 ENCOUNTER — Emergency Department (HOSPITAL_COMMUNITY)
Admission: EM | Admit: 2016-04-06 | Discharge: 2016-04-06 | Disposition: A | Discharge status: Home / Self Care | Payer: Medicaid Other | Attending: Dermatology | Admitting: Dermatology

## 2016-04-06 ENCOUNTER — Encounter (HOSPITAL_COMMUNITY): Payer: Self-pay | Admitting: Emergency Medicine

## 2016-04-06 DIAGNOSIS — Z5321 Procedure and treatment not carried out due to patient leaving prior to being seen by health care provider: Secondary | ICD-10-CM | POA: Insufficient documentation

## 2016-04-06 DIAGNOSIS — F10120 Alcohol abuse with intoxication, uncomplicated: Secondary | ICD-10-CM | POA: Insufficient documentation

## 2016-04-06 NOTE — ED Notes (Signed)
Pt ambulating around in triage states he is leaving  States he has been here long enough and has not seen a dr yet  Pt encouraged to stay but declined
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
## 2016-04-14 ENCOUNTER — Emergency Department (HOSPITAL_COMMUNITY)
Admission: EM | Admit: 2016-04-14 | Discharge: 2016-04-14 | Disposition: A | Discharge status: Home / Self Care | Payer: Medicaid Other | Attending: Emergency Medicine | Admitting: Emergency Medicine

## 2016-04-14 ENCOUNTER — Emergency Department (HOSPITAL_COMMUNITY): Payer: Medicaid Other

## 2016-04-14 ENCOUNTER — Encounter (HOSPITAL_COMMUNITY): Payer: Self-pay | Admitting: *Deleted

## 2016-04-14 DIAGNOSIS — S50812A Abrasion of left forearm, initial encounter: Secondary | ICD-10-CM | POA: Insufficient documentation

## 2016-04-14 DIAGNOSIS — S50811A Abrasion of right forearm, initial encounter: Secondary | ICD-10-CM | POA: Insufficient documentation

## 2016-04-14 DIAGNOSIS — R42 Dizziness and giddiness: Secondary | ICD-10-CM

## 2016-04-14 DIAGNOSIS — R05 Cough: Secondary | ICD-10-CM | POA: Diagnosis not present

## 2016-04-14 DIAGNOSIS — R109 Unspecified abdominal pain: Secondary | ICD-10-CM

## 2016-04-14 DIAGNOSIS — Z79899 Other long term (current) drug therapy: Secondary | ICD-10-CM | POA: Insufficient documentation

## 2016-04-14 DIAGNOSIS — Y999 Unspecified external cause status: Secondary | ICD-10-CM | POA: Diagnosis not present

## 2016-04-14 DIAGNOSIS — W1839XA Other fall on same level, initial encounter: Secondary | ICD-10-CM | POA: Insufficient documentation

## 2016-04-14 DIAGNOSIS — Z7951 Long term (current) use of inhaled steroids: Secondary | ICD-10-CM | POA: Insufficient documentation

## 2016-04-14 DIAGNOSIS — R296 Repeated falls: Secondary | ICD-10-CM

## 2016-04-14 DIAGNOSIS — M542 Cervicalgia: Secondary | ICD-10-CM | POA: Insufficient documentation

## 2016-04-14 DIAGNOSIS — M25511 Pain in right shoulder: Secondary | ICD-10-CM | POA: Insufficient documentation

## 2016-04-14 DIAGNOSIS — R1011 Right upper quadrant pain: Secondary | ICD-10-CM | POA: Diagnosis not present

## 2016-04-14 DIAGNOSIS — S2249XA Multiple fractures of ribs, unspecified side, initial encounter for closed fracture: Secondary | ICD-10-CM | POA: Clinically undetermined

## 2016-04-14 DIAGNOSIS — W19XXXA Unspecified fall, initial encounter: Secondary | ICD-10-CM

## 2016-04-14 DIAGNOSIS — Y929 Unspecified place or not applicable: Secondary | ICD-10-CM | POA: Diagnosis not present

## 2016-04-14 DIAGNOSIS — S59911A Unspecified injury of right forearm, initial encounter: Secondary | ICD-10-CM | POA: Diagnosis present

## 2016-04-14 DIAGNOSIS — M549 Dorsalgia, unspecified: Secondary | ICD-10-CM | POA: Diagnosis not present

## 2016-04-14 DIAGNOSIS — M25551 Pain in right hip: Secondary | ICD-10-CM | POA: Diagnosis not present

## 2016-04-14 DIAGNOSIS — R0602 Shortness of breath: Secondary | ICD-10-CM | POA: Insufficient documentation

## 2016-04-14 DIAGNOSIS — Y939 Activity, unspecified: Secondary | ICD-10-CM | POA: Insufficient documentation

## 2016-04-14 LAB — CBC WITH DIFFERENTIAL/PLATELET
Basophils Absolute: 0 10*3/uL (ref 0.0–0.1)
Basophils Relative: 0 %
Eosinophils Absolute: 0 10*3/uL (ref 0.0–0.7)
Eosinophils Relative: 0 %
HCT: 38.2 % — ABNORMAL LOW (ref 39.0–52.0)
Hemoglobin: 13 g/dL (ref 13.0–17.0)
Lymphocytes Relative: 7 %
Lymphs Abs: 1.2 10*3/uL (ref 0.7–4.0)
MCH: 32.9 pg (ref 26.0–34.0)
MCHC: 34 g/dL (ref 30.0–36.0)
MCV: 96.7 fL (ref 78.0–100.0)
Monocytes Absolute: 1.3 10*3/uL — ABNORMAL HIGH (ref 0.1–1.0)
Monocytes Relative: 7 %
Neutro Abs: 15.8 10*3/uL — ABNORMAL HIGH (ref 1.7–7.7)
Neutrophils Relative %: 86 %
Platelets: 338 10*3/uL (ref 150–400)
RBC: 3.95 MIL/uL — ABNORMAL LOW (ref 4.22–5.81)
RDW: 14 % (ref 11.5–15.5)
WBC: 18.4 10*3/uL — ABNORMAL HIGH (ref 4.0–10.5)

## 2016-04-14 LAB — URINALYSIS, ROUTINE W REFLEX MICROSCOPIC
Bilirubin Urine: NEGATIVE
Glucose, UA: NEGATIVE mg/dL
Hgb urine dipstick: NEGATIVE
Ketones, ur: 15 mg/dL — AB
Leukocytes, UA: NEGATIVE
Nitrite: NEGATIVE
Protein, ur: NEGATIVE mg/dL
Specific Gravity, Urine: 1.031 — ABNORMAL HIGH (ref 1.005–1.030)
pH: 7.5 (ref 5.0–8.0)

## 2016-04-14 LAB — BASIC METABOLIC PANEL
Anion gap: 8 (ref 5–15)
BUN: 8 mg/dL (ref 6–20)
CO2: 24 mmol/L (ref 22–32)
Calcium: 8.6 mg/dL — ABNORMAL LOW (ref 8.9–10.3)
Chloride: 104 mmol/L (ref 101–111)
Creatinine, Ser: 0.66 mg/dL (ref 0.61–1.24)
GFR calc Af Amer: >60 mL/min (ref >60)
GFR calc non Af Amer: >60 mL/min (ref >60)
Glucose, Bld: 104 mg/dL — ABNORMAL HIGH (ref 65–99)
Potassium: 4 mmol/L (ref 3.5–5.1)
Sodium: 136 mmol/L (ref 135–145)

## 2016-04-14 LAB — I-STAT CHEM 8, ED
BUN: 6 mg/dL (ref 6–20)
Calcium, Ion: 1.12 mmol/L (ref 1.12–1.23)
Chloride: 102 mmol/L (ref 101–111)
Creatinine, Ser: 0.6 mg/dL — ABNORMAL LOW (ref 0.61–1.24)
Glucose, Bld: 107 mg/dL — ABNORMAL HIGH (ref 65–99)
HCT: 40 % (ref 39.0–52.0)
Hemoglobin: 13.6 g/dL (ref 13.0–17.0)
Potassium: 4 mmol/L (ref 3.5–5.1)
Sodium: 139 mmol/L (ref 135–145)
TCO2: 24 mmol/L (ref 0–100)

## 2016-04-14 LAB — LIPASE, BLOOD: Lipase: 18 U/L (ref 11–51)

## 2016-04-14 MED ORDER — OXYCODONE-ACETAMINOPHEN 5-325 MG PO TABS
1.0000 | ORAL_TABLET | Freq: Once | ORAL | Status: AC
  Administered 2016-04-14: 1 via ORAL
  Filled 2016-04-14: qty 1

## 2016-04-14 MED ORDER — FENTANYL CITRATE (PF) 100 MCG/2ML IJ SOLN
50.0000 ug | Freq: Once | INTRAMUSCULAR | Status: AC
  Administered 2016-04-14: 50 ug via INTRAVENOUS
  Filled 2016-04-14: qty 2

## 2016-04-14 MED ORDER — SODIUM CHLORIDE 0.9 % IV BOLUS (SEPSIS)
1000.0000 mL | Freq: Once | INTRAVENOUS | Status: AC
  Administered 2016-04-14: 1000 mL via INTRAVENOUS

## 2016-04-14 MED ORDER — IOPAMIDOL (ISOVUE-300) INJECTION 61%
100.0000 mL | Freq: Once | INTRAVENOUS | Status: AC | PRN
  Administered 2016-04-14: 100 mL via INTRAVENOUS

## 2016-04-14 MED ORDER — HYDROMORPHONE HCL 1 MG/ML IJ SOLN
1.0000 mg | Freq: Once | INTRAMUSCULAR | Status: AC
  Administered 2016-04-14: 1 mg via INTRAVENOUS
  Filled 2016-04-14: qty 1

## 2016-04-14 MED ORDER — OXYCODONE-ACETAMINOPHEN 5-325 MG PO TABS: 1.0000 | ORAL_TABLET | Freq: Four times a day (QID) | ORAL | 0 refills | Status: DC | PRN

## 2016-04-14 NOTE — Discharge Instructions (Signed)
Medications: Percocet  Treatment: Take 1-2 Percocet every 4-6 hours as prescribed for your pain. Do not drive or  machinery when taking this medication and only take as prescribed. Do not take Tylenol with this medication. Make sure to always use your wheelchair to ambulate at home.  Follow-up: Please follow-up with your doctor on Monday for follow-up of today's visit.Marland Kitchen. Please follow-up with Dr. Sherlean FootLucey, an orthopedic doctor, or your own orthopedic doctor, if your symptoms are not improving. Please return to emergency department if you develop any new or worsening symptoms.  NOTE: You have a 4 mm pulmonary nodule that is recommended to have follow-up CT scan in 1 year. Please make your primary care provider aware of this.

## 2016-04-14 NOTE — ED Notes (Signed)
No respiratory or acute distress noted alert and oriented x 3 steady gait noted left before wheelchair arrived to take him to lobby no reaction to medication noted.
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
## 2016-04-14 NOTE — ED Notes (Signed)
Food and drink given no respiratory or acute distress noted alert and oriented x 3 call light in reach no reaction to medication noted.

## 2016-04-14 NOTE — ED Triage Notes (Signed)
Pt complains of back, bilateral arm, right knee pain since falling at 4AM today. Pt has chronic vertigo and is supposed to use a wheelchair. Pt was not using wheel chair when he fell.

## 2016-04-14 NOTE — ED Notes (Signed)
Bed: [facility] Expected date:  Expected time:  Means of arrival:  Comments: 

## 2016-05-16 ENCOUNTER — Institutional Professional Consult (permissible substitution): Payer: Self-pay | Admitting: Internal Medicine

## 2016-05-17 ENCOUNTER — Institutional Professional Consult (permissible substitution): Payer: Self-pay | Admitting: Internal Medicine

## 2016-05-28 ENCOUNTER — Emergency Department (HOSPITAL_COMMUNITY): Payer: Medicaid Other

## 2016-05-28 ENCOUNTER — Encounter (HOSPITAL_COMMUNITY): Payer: Self-pay

## 2016-05-28 ENCOUNTER — Emergency Department (HOSPITAL_COMMUNITY)
Admission: EM | Admit: 2016-05-28 | Discharge: 2016-05-28 | Discharge status: Against Medical Advice | Payer: Medicaid Other | Attending: Emergency Medicine | Admitting: Emergency Medicine

## 2016-05-28 DIAGNOSIS — R52 Pain, unspecified: Secondary | ICD-10-CM

## 2016-05-28 DIAGNOSIS — W1830XA Fall on same level, unspecified, initial encounter: Secondary | ICD-10-CM | POA: Insufficient documentation

## 2016-05-28 DIAGNOSIS — R55 Syncope and collapse: Secondary | ICD-10-CM | POA: Diagnosis present

## 2016-05-28 DIAGNOSIS — F17200 Nicotine dependence, unspecified, uncomplicated: Secondary | ICD-10-CM | POA: Insufficient documentation

## 2016-05-28 DIAGNOSIS — Y999 Unspecified external cause status: Secondary | ICD-10-CM | POA: Insufficient documentation

## 2016-05-28 DIAGNOSIS — Z79899 Other long term (current) drug therapy: Secondary | ICD-10-CM | POA: Diagnosis not present

## 2016-05-28 DIAGNOSIS — R10817 Generalized abdominal tenderness: Secondary | ICD-10-CM | POA: Insufficient documentation

## 2016-05-28 DIAGNOSIS — M25561 Pain in right knee: Secondary | ICD-10-CM | POA: Insufficient documentation

## 2016-05-28 DIAGNOSIS — Y939 Activity, unspecified: Secondary | ICD-10-CM | POA: Insufficient documentation

## 2016-05-28 DIAGNOSIS — Y929 Unspecified place or not applicable: Secondary | ICD-10-CM | POA: Diagnosis not present

## 2016-05-28 DIAGNOSIS — I959 Hypotension, unspecified: Secondary | ICD-10-CM | POA: Diagnosis not present

## 2016-05-28 LAB — CBC WITH DIFFERENTIAL/PLATELET
Basophils Absolute: 0 10*3/uL (ref 0.0–0.1)
Basophils Relative: 0 %
EOS ABS: 0 10*3/uL (ref 0.0–0.7)
EOS PCT: 0 %
HCT: 38.1 % — ABNORMAL LOW (ref 39.0–52.0)
Hemoglobin: 12.7 g/dL — ABNORMAL LOW (ref 13.0–17.0)
LYMPHS ABS: 2 10*3/uL (ref 0.7–4.0)
LYMPHS PCT: 12 %
MCH: 31.4 pg (ref 26.0–34.0)
MCHC: 33.3 g/dL (ref 30.0–36.0)
MCV: 94.3 fL (ref 78.0–100.0)
MONO ABS: 0.9 10*3/uL (ref 0.1–1.0)
Monocytes Relative: 5 %
Neutro Abs: 13.3 10*3/uL — ABNORMAL HIGH (ref 1.7–7.7)
Neutrophils Relative %: 83 %
PLATELETS: 271 10*3/uL (ref 150–400)
RBC: 4.04 MIL/uL — AB (ref 4.22–5.81)
RDW: 14.7 % (ref 11.5–15.5)
WBC: 16.2 10*3/uL — AB (ref 4.0–10.5)

## 2016-05-28 LAB — RAPID URINE DRUG SCREEN, HOSP PERFORMED
Amphetamines: NOT DETECTED
BARBITURATES: NOT DETECTED
BENZODIAZEPINES: NOT DETECTED
Cocaine: NOT DETECTED
Opiates: NOT DETECTED
Tetrahydrocannabinol: POSITIVE — AB

## 2016-05-28 LAB — I-STAT CG4 LACTIC ACID, ED: Lactic Acid, Venous: 1.61 mmol/L (ref 0.5–1.9)

## 2016-05-28 LAB — URINALYSIS, ROUTINE W REFLEX MICROSCOPIC
BILIRUBIN URINE: NEGATIVE
Glucose, UA: NEGATIVE mg/dL
Hgb urine dipstick: NEGATIVE
Ketones, ur: NEGATIVE mg/dL
LEUKOCYTES UA: NEGATIVE
NITRITE: NEGATIVE
PH: 6.5 (ref 5.0–8.0)
Protein, ur: NEGATIVE mg/dL
SPECIFIC GRAVITY, URINE: 1.003 — AB (ref 1.005–1.030)

## 2016-05-28 LAB — COMPREHENSIVE METABOLIC PANEL
ALT: 11 U/L — AB (ref 17–63)
ANION GAP: 14 (ref 5–15)
AST: 18 U/L (ref 15–41)
Albumin: 2.7 g/dL — ABNORMAL LOW (ref 3.5–5.0)
Alkaline Phosphatase: 116 U/L (ref 38–126)
BUN: <5 mg/dL — ABNORMAL LOW (ref 6–20)
CHLORIDE: 102 mmol/L (ref 101–111)
CO2: 18 mmol/L — AB (ref 22–32)
CREATININE: 0.72 mg/dL (ref 0.61–1.24)
Calcium: 8.6 mg/dL — ABNORMAL LOW (ref 8.9–10.3)
Glucose, Bld: 90 mg/dL (ref 65–99)
POTASSIUM: 3.1 mmol/L — AB (ref 3.5–5.1)
SODIUM: 134 mmol/L — AB (ref 135–145)
Total Bilirubin: 0.6 mg/dL (ref 0.3–1.2)
Total Protein: 7.3 g/dL (ref 6.5–8.1)

## 2016-05-28 LAB — ACETAMINOPHEN LEVEL

## 2016-05-28 LAB — PROTIME-INR
INR: 1.25
Prothrombin Time: 15.8 seconds — ABNORMAL HIGH (ref 11.4–15.2)

## 2016-05-28 LAB — TYPE AND SCREEN
ABO/RH(D): O POS
ANTIBODY SCREEN: NEGATIVE

## 2016-05-28 LAB — ETHANOL: ALCOHOL ETHYL (B): 278 mg/dL — AB (ref <5)

## 2016-05-28 LAB — ABO/RH: ABO/RH(D): O POS

## 2016-05-28 LAB — LIPASE, BLOOD: LIPASE: 42 U/L (ref 11–51)

## 2016-05-28 MED ORDER — SODIUM CHLORIDE 0.9 % IV BOLUS (SEPSIS)
1000.0000 mL | Freq: Once | INTRAVENOUS | Status: AC
Start: 2016-05-28 — End: 2016-05-28
  Administered 2016-05-28: 1000 mL via INTRAVENOUS

## 2016-05-28 MED ORDER — IBUPROFEN 400 MG PO TABS
600.0000 mg | ORAL_TABLET | Freq: Once | ORAL | Status: AC
  Administered 2016-05-28: 600 mg via ORAL
  Filled 2016-05-28: qty 1

## 2016-05-28 NOTE — ED Triage Notes (Signed)
Pt complaining of lighthead and dizziness. Pt complaining of SOB and chest pain. Pt BP at triage = 75's/50's. Pt a/o x 4.

## 2016-05-28 NOTE — ED Notes (Signed)
Pt is complaining of pain. Will not specify the exact location and states "if I don't get nothing for pain, then I'll just leave and sue"
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
## 2016-05-28 NOTE — ED Notes (Signed)
Pt leave AMA oriented before leaving ED that we will not be responsible of any negative out come of him leaving the ED, EDP consider that pt meet criteria for admission and pt oriented that he may put him self on danger by refusing care. Pt verbalize understanding and sign for leaving AMA.

## 2016-05-28 NOTE — ED Notes (Signed)
Pt getting aggressive and trying to leave the hospital against Medical advice, EDP notified and Resident talking to the pt on the bedside. Pt oriented about consequence to leave against medical advice and pt understand that he will be responsible for anything that happens to him out of the hospital. Pt to sign his AMA.

## 2016-05-28 NOTE — ED Notes (Signed)
Pt brought in to room appears awake and alert appears in no distress pt states that he has been coughing up blood since he fell approx 4 hours ago.  Pt states that in the past he has been treated for TB representative with pt. States that due to pt's previous +PPD test he was brought in for a chest x-ray regarding the Tb, pt placed on airborne precautions.  Pt appears focused on pain medicine and consistently asking for pain medicine ER MD to bedside.  Pt on monitor IV placed with IVF infusing

## 2016-05-28 NOTE — ED Notes (Signed)
Jenette with ACT team can come back to bring pt home just call [phone]

## 2016-05-28 NOTE — ED Notes (Signed)
First bp LA 84/50 Second bp LA 78/50 Third bp LA 75/55 Nurse notified.

## 2016-06-02 LAB — CULTURE, BLOOD (ROUTINE X 2)
Culture: NO GROWTH
Culture: NO GROWTH

## 2016-08-12 ENCOUNTER — Emergency Department (HOSPITAL_COMMUNITY)
Admission: EM | Admit: 2016-08-12 | Discharge: 2016-08-12 | Disposition: A | Discharge status: Home / Self Care | Payer: Medicaid Other | Attending: Emergency Medicine | Admitting: Emergency Medicine

## 2016-08-12 ENCOUNTER — Encounter (HOSPITAL_COMMUNITY): Payer: Self-pay

## 2016-08-12 ENCOUNTER — Emergency Department (HOSPITAL_COMMUNITY): Payer: Medicaid Other

## 2016-08-12 DIAGNOSIS — F17200 Nicotine dependence, unspecified, uncomplicated: Secondary | ICD-10-CM | POA: Diagnosis not present

## 2016-08-12 DIAGNOSIS — Z79899 Other long term (current) drug therapy: Secondary | ICD-10-CM | POA: Diagnosis not present

## 2016-08-12 DIAGNOSIS — F10920 Alcohol use, unspecified with intoxication, uncomplicated: Secondary | ICD-10-CM

## 2016-08-12 DIAGNOSIS — F10229 Alcohol dependence with intoxication, unspecified: Secondary | ICD-10-CM | POA: Diagnosis not present

## 2016-08-12 DIAGNOSIS — R42 Dizziness and giddiness: Secondary | ICD-10-CM | POA: Diagnosis present

## 2016-08-12 LAB — ETHANOL: Alcohol, Ethyl (B): 210 mg/dL — ABNORMAL HIGH (ref <5)

## 2016-08-12 LAB — I-STAT CHEM 8, ED
CALCIUM ION: 1.14 mmol/L — AB (ref 1.15–1.40)
CHLORIDE: 106 mmol/L (ref 101–111)
CREATININE: 1 mg/dL (ref 0.61–1.24)
Glucose, Bld: 129 mg/dL — ABNORMAL HIGH (ref 65–99)
HEMATOCRIT: 40 % (ref 39.0–52.0)
Hemoglobin: 13.6 g/dL (ref 13.0–17.0)
Potassium: 3.6 mmol/L (ref 3.5–5.1)
SODIUM: 146 mmol/L — AB (ref 135–145)
TCO2: 25 mmol/L (ref 0–100)

## 2016-08-12 NOTE — ED Notes (Signed)
Patient states that he would like to go home and then took out his IV.  Bleeding was controlled.  MD made aware and is discharging the patient home.  Patient is stable for discharge and belongings are with the patient.
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
## 2016-08-12 NOTE — ED Notes (Signed)
Patient Alert and oriented X4. Stable and ambulatory. Patient verbalized understanding of the discharge instructions.  Patient belongings were taken by the patient.  
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
## 2016-08-12 NOTE — ED Triage Notes (Signed)
[facility] comes from [facility] for dizziness and shortness of breath.  Patient is A&Ox4 at this time no acute distress.  Hx of COPD and 96 on room air.

## 2016-09-28 ENCOUNTER — Institutional Professional Consult (permissible substitution): Payer: Self-pay | Admitting: Internal Medicine

## 2016-10-16 ENCOUNTER — Ambulatory Visit (INDEPENDENT_AMBULATORY_CARE_PROVIDER_SITE_OTHER): Payer: Medicaid Other | Admitting: Physician Assistant

## 2016-11-06 ENCOUNTER — Ambulatory Visit (INDEPENDENT_AMBULATORY_CARE_PROVIDER_SITE_OTHER): Payer: Medicaid Other | Admitting: Orthopaedic Surgery

## 2016-11-06 ENCOUNTER — Ambulatory Visit (INDEPENDENT_AMBULATORY_CARE_PROVIDER_SITE_OTHER): Payer: Medicaid Other

## 2016-11-06 ENCOUNTER — Encounter (INDEPENDENT_AMBULATORY_CARE_PROVIDER_SITE_OTHER): Payer: Self-pay

## 2016-11-06 ENCOUNTER — Encounter (INDEPENDENT_AMBULATORY_CARE_PROVIDER_SITE_OTHER): Payer: Self-pay | Admitting: Orthopaedic Surgery

## 2016-11-06 DIAGNOSIS — M898X2 Other specified disorders of bone, upper arm: Secondary | ICD-10-CM

## 2016-11-06 DIAGNOSIS — M25512 Pain in left shoulder: Secondary | ICD-10-CM

## 2016-11-06 DIAGNOSIS — M79622 Pain in left upper arm: Secondary | ICD-10-CM

## 2016-11-06 MED ORDER — TRAMADOL HCL 50 MG PO TABS: 50.0000 mg | ORAL_TABLET | Freq: Four times a day (QID) | ORAL | 0 refills | Status: DC | PRN

## 2016-11-06 MED ORDER — BUPIVACAINE HCL 0.5 % IJ SOLN
3.0000 mL | INTRAMUSCULAR | Status: AC | PRN
  Administered 2016-11-06: 3 mL via INTRA_ARTICULAR

## 2016-11-06 MED ORDER — METHYLPREDNISOLONE ACETATE 40 MG/ML IJ SUSP
40.0000 mg | INTRAMUSCULAR | Status: AC | PRN
  Administered 2016-11-06: 40 mg via INTRA_ARTICULAR

## 2016-11-06 MED ORDER — LIDOCAINE HCL 1 % IJ SOLN
3.0000 mL | INTRAMUSCULAR | Status: AC | PRN
  Administered 2016-11-06: 3 mL
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
## 2016-11-28 ENCOUNTER — Emergency Department (HOSPITAL_COMMUNITY): Payer: Medicaid Other

## 2016-11-28 ENCOUNTER — Encounter (HOSPITAL_COMMUNITY): Payer: Self-pay

## 2016-11-28 ENCOUNTER — Emergency Department (HOSPITAL_COMMUNITY)
Admission: EM | Admit: 2016-11-28 | Discharge: 2016-11-28 | Discharge status: Against Medical Advice | Payer: Medicaid Other | Attending: Emergency Medicine | Admitting: Emergency Medicine

## 2016-11-28 DIAGNOSIS — F17200 Nicotine dependence, unspecified, uncomplicated: Secondary | ICD-10-CM | POA: Diagnosis not present

## 2016-11-28 DIAGNOSIS — F10120 Alcohol abuse with intoxication, uncomplicated: Secondary | ICD-10-CM | POA: Diagnosis not present

## 2016-11-28 DIAGNOSIS — F10920 Alcohol use, unspecified with intoxication, uncomplicated: Secondary | ICD-10-CM

## 2016-11-28 DIAGNOSIS — R55 Syncope and collapse: Secondary | ICD-10-CM | POA: Insufficient documentation

## 2016-11-28 LAB — HEPATIC FUNCTION PANEL
ALT: 22 U/L (ref 17–63)
AST: 29 U/L (ref 15–41)
Albumin: 3.5 g/dL (ref 3.5–5.0)
Alkaline Phosphatase: 82 U/L (ref 38–126)
Bilirubin, Direct: 0.1 mg/dL (ref 0.1–0.5)
Indirect Bilirubin: 0.4 mg/dL (ref 0.3–0.9)
Total Bilirubin: 0.5 mg/dL (ref 0.3–1.2)
Total Protein: 5.8 g/dL — ABNORMAL LOW (ref 6.5–8.1)

## 2016-11-28 LAB — BASIC METABOLIC PANEL WITH GFR
Anion gap: 10 (ref 5–15)
BUN: 6 mg/dL (ref 6–20)
CO2: 22 mmol/L (ref 22–32)
Calcium: 8.1 mg/dL — ABNORMAL LOW (ref 8.9–10.3)
Chloride: 108 mmol/L (ref 101–111)
Creatinine, Ser: 0.77 mg/dL (ref 0.61–1.24)
GFR calc Af Amer: >60 mL/min
GFR calc non Af Amer: >60 mL/min
Glucose, Bld: 95 mg/dL (ref 65–99)
Potassium: 3.1 mmol/L — ABNORMAL LOW (ref 3.5–5.1)
Sodium: 140 mmol/L (ref 135–145)

## 2016-11-28 LAB — CBC
HCT: 38.3 % — ABNORMAL LOW (ref 39.0–52.0)
Hemoglobin: 12.6 g/dL — ABNORMAL LOW (ref 13.0–17.0)
MCH: 31.3 pg (ref 26.0–34.0)
MCHC: 32.9 g/dL (ref 30.0–36.0)
MCV: 95.3 fL (ref 78.0–100.0)
Platelets: 188 K/uL (ref 150–400)
RBC: 4.02 MIL/uL — ABNORMAL LOW (ref 4.22–5.81)
RDW: 14.9 % (ref 11.5–15.5)
WBC: 9.3 K/uL (ref 4.0–10.5)

## 2016-11-28 LAB — I-STAT TROPONIN, ED: Troponin i, poc: 0 ng/mL (ref 0.00–0.08)

## 2016-11-28 LAB — ETHANOL: Alcohol, Ethyl (B): 234 mg/dL — ABNORMAL HIGH

## 2016-11-28 LAB — LIPASE, BLOOD: Lipase: 149 U/L — ABNORMAL HIGH (ref 11–51)

## 2016-11-28 MED ORDER — SODIUM CHLORIDE 0.9 % IV BOLUS (SEPSIS)
500.0000 mL | Freq: Once | INTRAVENOUS | Status: AC
  Administered 2016-11-28: 500 mL via INTRAVENOUS

## 2016-11-28 MED ORDER — VITAMIN B-1 100 MG PO TABS
100.0000 mg | ORAL_TABLET | Freq: Once | ORAL | Status: AC
  Administered 2016-11-28: 100 mg via ORAL
  Filled 2016-11-28: qty 1

## 2016-11-28 NOTE — ED Notes (Signed)
Pt refusing CT

## 2016-11-28 NOTE — ED Notes (Addendum)
Pt transported to xray at this time

## 2016-11-28 NOTE — ED Notes (Addendum)
Pt took out his own IV, stating he has to leave. EDP aware and states pt would have to sign out AMA. Pt took off his own ccollar . Pt informed risks of leaving AMA as well as risks of taking off his ccollar, including worsening condition or death, but pt verbalized understanding.

## 2016-11-28 NOTE — ED Notes (Signed)
Pt observed ambulating out of ED with his own personal walker. Alert and oriented
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
## 2016-12-04 ENCOUNTER — Institutional Professional Consult (permissible substitution): Payer: Self-pay | Admitting: Internal Medicine

## 2017-01-03 ENCOUNTER — Institutional Professional Consult (permissible substitution): Payer: Self-pay | Admitting: Internal Medicine

## 2017-02-08 ENCOUNTER — Encounter: Payer: Self-pay | Admitting: Internal Medicine

## 2017-02-08 ENCOUNTER — Ambulatory Visit (INDEPENDENT_AMBULATORY_CARE_PROVIDER_SITE_OTHER): Payer: Medicaid Other | Admitting: Internal Medicine

## 2017-02-08 VITALS — BP 122/60 | HR 82 | Ht 68.0 in | Wt 127.0 lb

## 2017-02-08 DIAGNOSIS — J449 Chronic obstructive pulmonary disease, unspecified: Secondary | ICD-10-CM

## 2017-02-08 DIAGNOSIS — F17210 Nicotine dependence, cigarettes, uncomplicated: Secondary | ICD-10-CM | POA: Diagnosis not present

## 2017-02-08 DIAGNOSIS — R918 Other nonspecific abnormal finding of lung field: Secondary | ICD-10-CM

## 2017-02-08 NOTE — Patient Instructions (Addendum)
The key is to stop smoking completely before smoking completely stops you!   Please remember to go to the  x-ray department downstairs in the basement  for your tests - we will call you with the results when they are available.  No need for inhalers at this point - just breathe clean air       Please schedule a follow up office visit in 6 months  call sooner if needed

## 2017-02-08 NOTE — Progress Notes (Signed)
Subjective:     Patient ID: Jesse Aguilar, male   DOB: [birth year],     MRN: [id number]  HPI   [age] ywom active smoker referred to pulmonary clinic 02/08/2017 by Jesse GunningMaggie Mazurek NP re  ? Lung nodules in setting of GOLD I copd documented at initial ov    02/08/2017 [facility] office visit/ Jesse Aguilar  Only on prn saba but not using  Chief Complaint  Patient presents with  . Advice Only    Referred by Dr. Manus GunningMaggie Aguilar for eval of pulmonary nodule.    Not limited by breathing from desired activities  But by weak legs/ uses walker, very inactive  Has had some rattling coughing in past but denies at present  No inhalers recently, only helped a little In past   No obvious day to day or daytime variability or assoc excess/ purulent sputum or mucus plugs or hemoptysis or cp or chest tightness, subjective wheeze or overt sinus or hb symptoms. No unusual exp hx or h/o childhood pna/ asthma or knowledge of premature birth.  Sleeping ok without nocturnal  or early am exacerbation  of respiratory  c/o's or need for noct saba. Also denies any obvious fluctuation of symptoms with weather or environmental changes or other aggravating or alleviating factors except as outlined above   Current Medications, Allergies, Complete Past Medical History, Past Surgical History, Family History, and Social History were reviewed in [facility] electronic medical record.  ROS  The following are not active complaints unless bolded sore throat, dysphagia, dental problems, itching, sneezing,  nasal congestion or excess/ purulent secretions, ear ache,   fever, chills, sweats, unintended wt loss, classically pleuritic or exertional cp,  orthopnea pnd or leg swelling, presyncope, palpitations, abdominal pain, anorexia, nausea, vomiting, diarrhea  or change in bowel or bladder habits, change in stools or urine, dysuria,hematuria,  rash, arthralgias, visual complaints, headache, numbness, weakness or ataxia or problems with  walking or coordination,  change in mood/affect or memory.            Review of Systems     Objective:   Physical Exam    Stoic disheveled  wm >> stated age  Wt Readings from Last 3 Encounters:  02/08/17 127 lb (57.6 kg)  01/03/16 138 lb (62.6 kg)  11/05/15 138 lb (62.6 kg)    Vital signs reviewed  - Note on arrival 02 sats  97% on RA      HEENT: nl   turbinates bilaterally, and oropharynx. Nl external ear canals without cough reflex - poor dentition   NECK :  without JVD/Nodes/TM/ nl carotid upstrokes bilaterally   LUNGS: no acc muscle use, slt barrel contour with distant bs bilaterally s audible wheeze   CV:  RRR  no s3 or murmur or increase in P2, and no edema   ABD:  soft and nontender with nl inspiratory excursion in the supine position. No bruits or organomegaly appreciated, bowel sounds nl  MS:  Nl gait/ ext warm without deformities, calf tenderness, cyanosis or clubbing No obvious joint restrictions   SKIN: warm and dry without lesions    NEURO:  alert, approp, nl sensorium with  no motor or cerebellar deficits apparent.     CXR PA and Lateral:   02/08/2017 :    I personally reviewed images and agree with radiology impression as follows:    refused cxr     Assessment:

## 2017-02-08 NOTE — Assessment & Plan Note (Signed)

## 2017-02-10 DIAGNOSIS — R918 Other nonspecific abnormal finding of lung field: Secondary | ICD-10-CM | POA: Insufficient documentation

## 2017-02-10 NOTE — Assessment & Plan Note (Addendum)
Discussed in detail all the  indications, usual  risks and alternatives  relative to the benefits with patient who refused further w/u at significant risk of death if any of the reported nodules turns out to be a neoplasm related to smoking   Have requested any available imaging reports from PCP  Pulmonary f/u can be at discretion of PCP or seek another opinion from one of our surrounding medical centers

## 2017-02-10 NOTE — Assessment & Plan Note (Addendum)
Active smoker Spirometry 02/08/2017  FEV1 2.92 (91%)  Ratio 79 with abnormal effort dep portion of f/v loop   No role for maint rx in this setting but rather focus on efforts to stops smoking (see separate a/p)      I reviewed the Fletcher curve with the patient that basically indicates  if you quit smoking when your best day FEV1 is still well preserved (as is clearly  the case here)  it is highly unlikely you will progress to severe disease and informed the patient there was  no medication on the market that has proven to alter the curve/ its downward trajectory  or the likelihood of progression of their disease(unlike other chronic medical conditions such as atheroclerosis where we do think we can change the natural hx with risk reducing meds)    Therefore stopping smoking and maintaining abstinence is the most important aspect of care, not choice of inhalers or for that matter, doctors.     Therefor f/u here can be prn   Total time devoted to counseling  > 50 % of initial 60 min office visit:  review case with pt/ discussion of options/alternatives/ personally creating written customized instructions  in presence of pt  then going over those specific  Instructions directly with the pt including how to use all of the meds but in particular covering each new medication in detail and the difference between the maintenance= "automatic" meds and the prns using an action plan format for the latter (If this problem/symptom => do that organization reading Left to right).  Please see AVS from this visit for a full list of these instructions which I personally wrote for this pt and  are unique to this visit.

## 2017-03-18 ENCOUNTER — Ambulatory Visit (INDEPENDENT_AMBULATORY_CARE_PROVIDER_SITE_OTHER): Payer: Medicaid Other | Admitting: Orthopaedic Surgery

## 2017-03-18 ENCOUNTER — Ambulatory Visit (INDEPENDENT_AMBULATORY_CARE_PROVIDER_SITE_OTHER): Payer: Medicaid Other

## 2017-03-18 ENCOUNTER — Telehealth (INDEPENDENT_AMBULATORY_CARE_PROVIDER_SITE_OTHER): Payer: Self-pay | Admitting: *Deleted

## 2017-03-18 ENCOUNTER — Encounter (INDEPENDENT_AMBULATORY_CARE_PROVIDER_SITE_OTHER): Payer: Self-pay | Admitting: Orthopaedic Surgery

## 2017-03-18 DIAGNOSIS — IMO0001 Reserved for inherently not codable concepts without codable children: Secondary | ICD-10-CM

## 2017-03-18 DIAGNOSIS — M4850XA Collapsed vertebra, not elsewhere classified, site unspecified, initial encounter for fracture: Secondary | ICD-10-CM

## 2017-03-18 MED ORDER — HYDROCODONE-ACETAMINOPHEN 5-325 MG PO TABS: 1.0000 | ORAL_TABLET | Freq: Two times a day (BID) | ORAL | 0 refills | Status: DC | PRN

## 2017-03-18 NOTE — Telephone Encounter (Signed)
Jesse HuaDavid called stating pt was prescribed Norco and states that pt is allergic to codeine and can not take norco, wants to know if there is anything else can be given.  Please advise: [phone]

## 2017-03-18 NOTE — Telephone Encounter (Signed)
Please advise 
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
## 2017-03-19 NOTE — Telephone Encounter (Signed)
The only one that doesn't have codeine is tramadol.

## 2017-03-20 NOTE — Telephone Encounter (Signed)
Dilaudid #20. 1 tab po bid prn pain

## 2017-03-20 NOTE — Telephone Encounter (Signed)
Called Onalee HuaDavid back and states he stopped Taking Tramadol about 2 weeks ago bc that was not helping and needs something different. See messages below.

## 2017-03-21 ENCOUNTER — Telehealth (INDEPENDENT_AMBULATORY_CARE_PROVIDER_SITE_OTHER): Payer: Self-pay | Admitting: Radiology

## 2017-03-21 IMAGING — CT CT CERVICAL SPINE W/O CM
3 of 4 series · 12 of 35 positions shown, 14 images · non-contrast
Comparison: 11/05/2015.

CLINICAL DATA: Neck pain after fall today.  Patient fell at 4 a.m..

EXAM:
CT CERVICAL SPINE WITHOUT CONTRAST
TECHNIQUE: Multidetector CT imaging of the cervical spine was performed without
intravenous contrast. Multiplanar CT image reconstructions were also
generated.

[Series 6: axial reformats · axial · 0.23mm/px · z∈[-244,-91]mm · 4 of 112 slices shown, 5 images]
[im 16/112  soft-tissue]
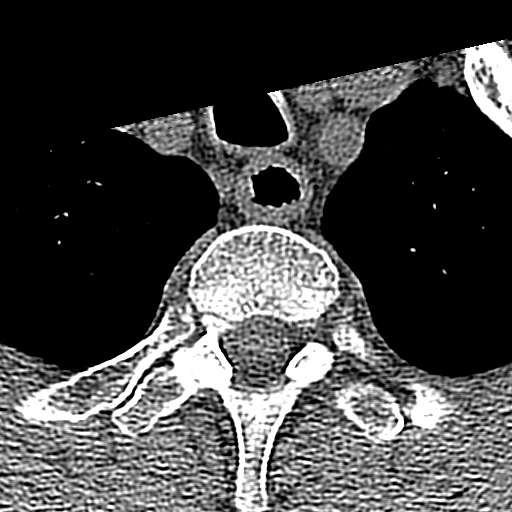
[im 16/112  bone]
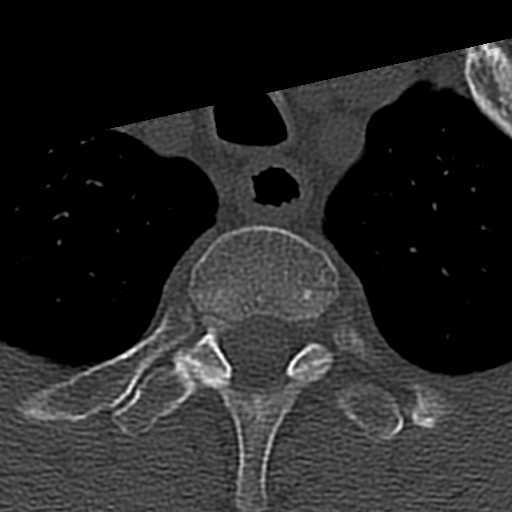
[im 48/112  bone]
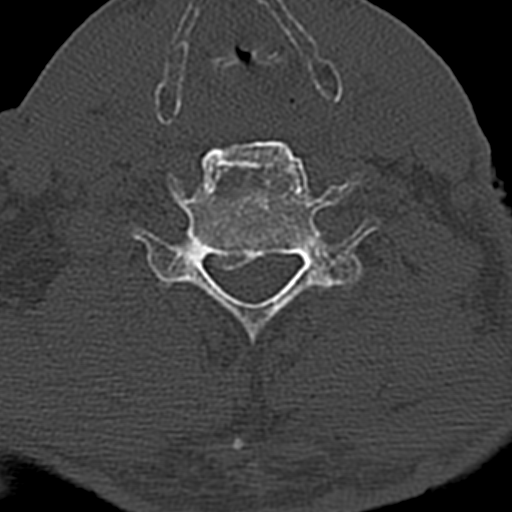
[im 64/112  bone]
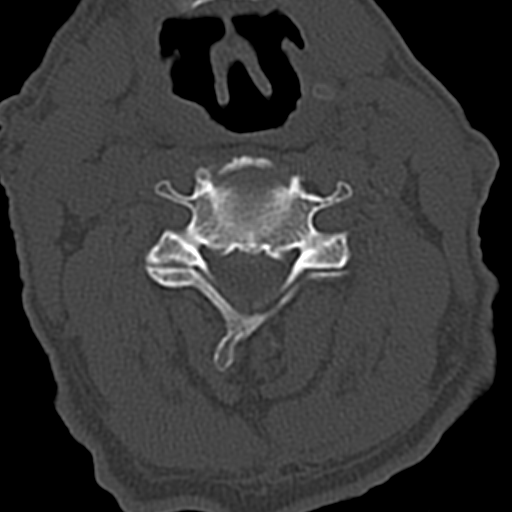
[im 96/112  bone]
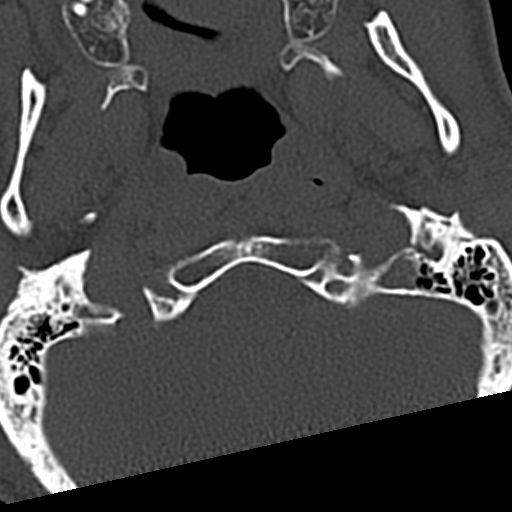

[Series 7: coronal recons · coronal · 0.25mm/px · 3 of 61 slices shown]
[im 13/61  bone]
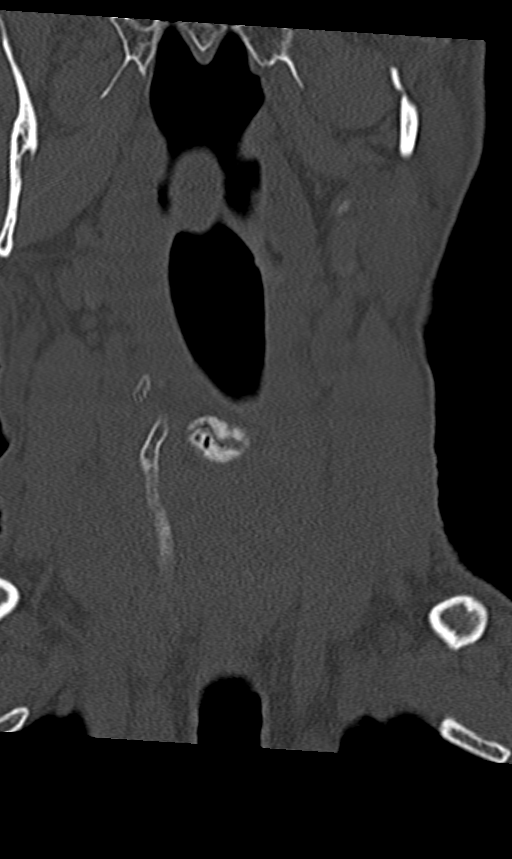
[im 25/61  bone]
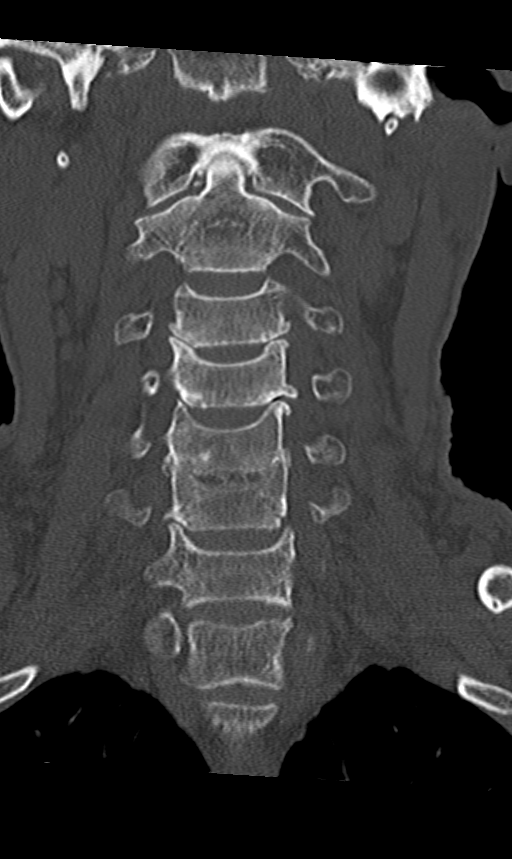
[im 37/61  bone]
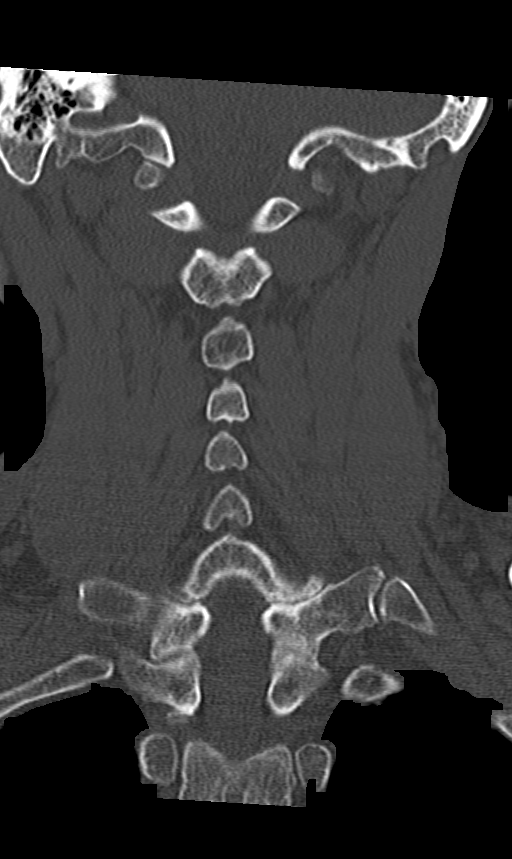

[Series 8: sagittal recons · sagittal · 0.25mm/px · 5 of 61 slices shown, 6 images]
[im 21/61  bone]
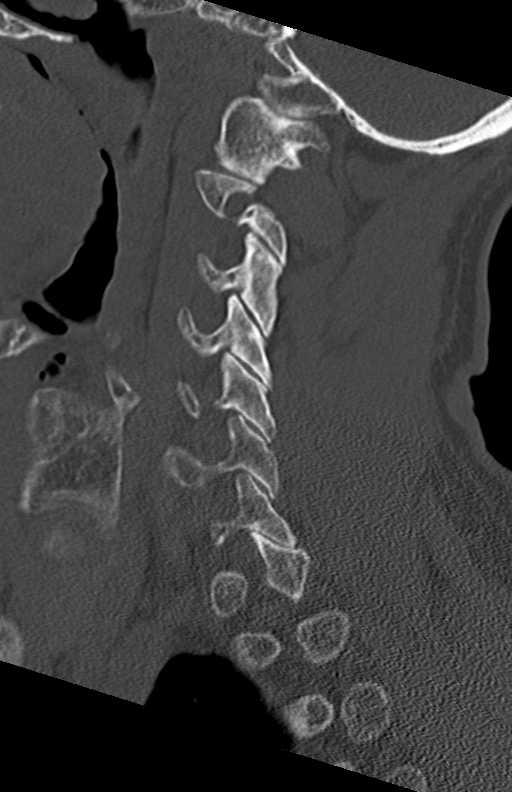
[im 26/61  bone]
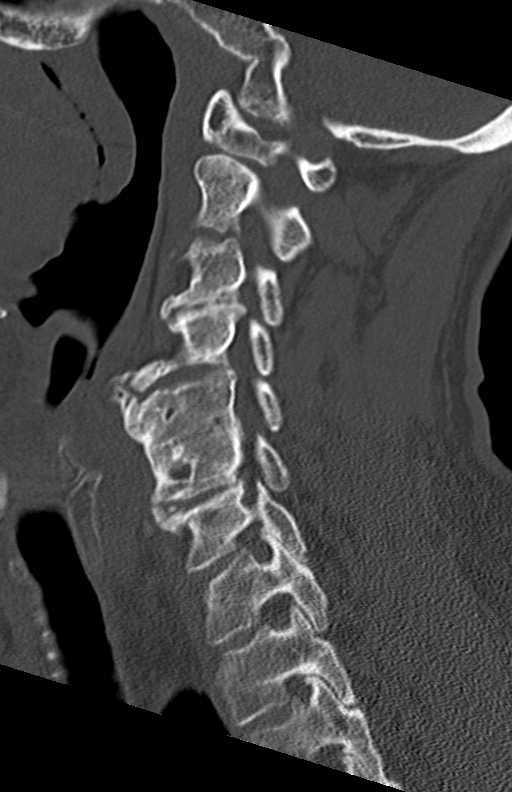
[im 31/61  soft-tissue]
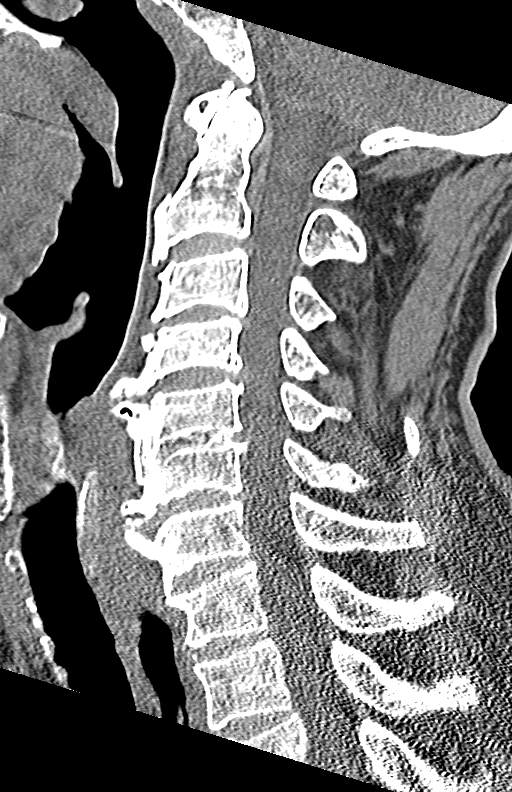
[im 31/61  bone]
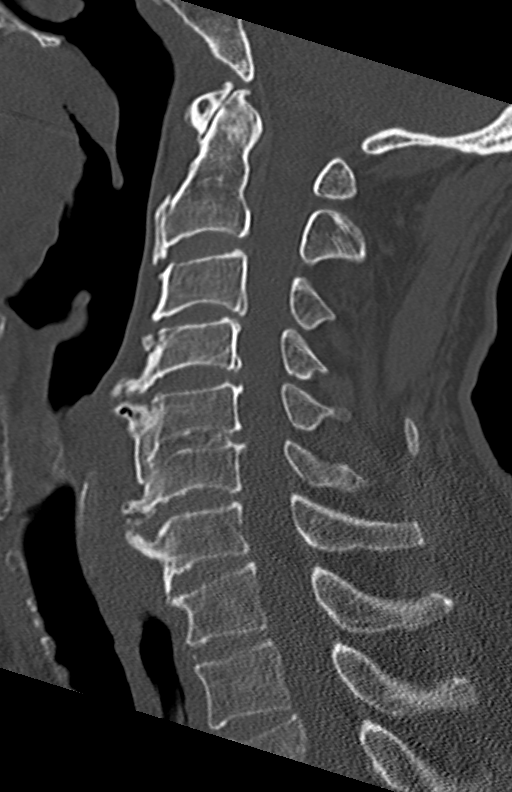
[im 36/61  bone]
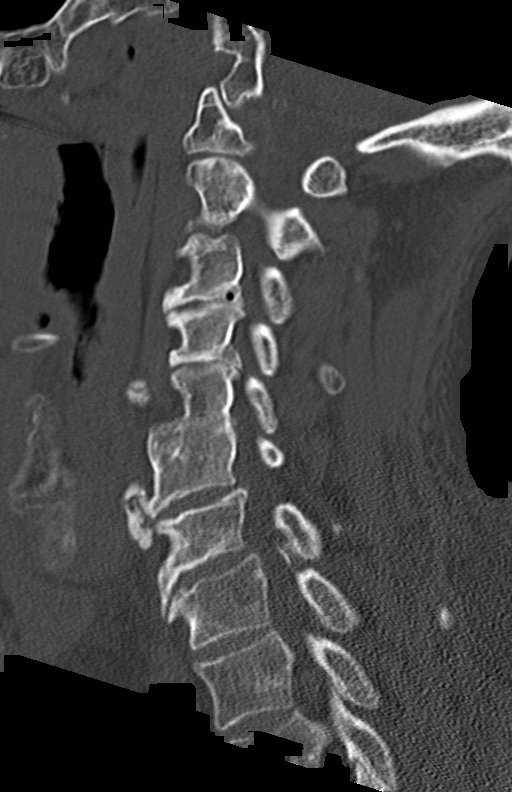
[im 41/61  bone]
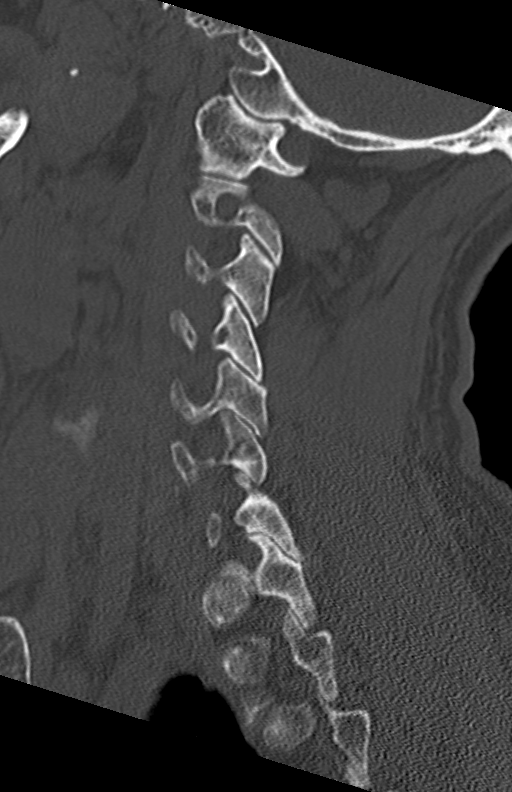

[12 of 35 positions shown; findings below may reference images not displayed]

FINDINGS: Imaging was obtained from the skullbase through the T2 vertebral
body. No fracture. No subluxation. Prominent anterior spurring seen
from C4-C7 with solid fusion anteriorly at C5-6. Facets are well
aligned bilaterally. No prevertebral soft tissue swelling. Normal
cervical lordosis is preserved.

4 mm posterior left upper lobe pulmonary nodule seen on image 86
series 3.
IMPRESSION: 1. Stable.  Degenerative changes without acute fracture.
2. 4 mm posterior left apical pulmonary nodule. If the patient is at
high risk for bronchogenic carcinoma, follow-up chest CT at 1 year
is recommended. If the patient is at low risk, no follow-up is
needed. This recommendation follows the consensus statement:
Guidelines for Management of Small Pulmonary Nodules Detected on CT
Scans: A Statement from the [HOSPITAL] as published in

## 2017-03-21 MED ORDER — HYDROMORPHONE HCL 2 MG PO TABS: 2.0000 mg | ORAL_TABLET | Freq: Four times a day (QID) | ORAL | 0 refills | Status: DC | PRN

## 2017-03-21 NOTE — Telephone Encounter (Signed)
Please advise.  I will be out of the office tomorrow.

## 2017-03-21 NOTE — Telephone Encounter (Signed)
Pharmacy fills orders for group home and takes to facility. They received script for Dilaudid today but patient is allergic to codeine and they are not allowed to fill it. This would be the same with Hydrocodone, etc.  After speaking with Rexene EdisonGil Clark, PA-C and Dr. Magnus IvanBlackman, it was discussed with the pharmacy that there was really nothing else that we could offer patient for his pain that would be appropriate other than have him resume the tramadol that he states does not work.   The pharmacy states that the tramadol has most likely been discontinued and that a new script would most likely have to be sent over.  Please call Onalee HuaDavid who takes care of patient to advise of medication problem and see if there is tramadol left for the patient to take.

## 2017-03-25 ENCOUNTER — Ambulatory Visit
Admission: RE | Admit: 2017-03-25 | Discharge: 2017-03-25 | Disposition: A | Discharge status: Home / Self Care | Payer: Medicaid Other | Source: Ambulatory Visit | Attending: Internal Medicine | Admitting: Internal Medicine

## 2017-03-25 ENCOUNTER — Ambulatory Visit
Admission: RE | Admit: 2017-03-25 | Discharge: 2017-03-25 | Disposition: A | Discharge status: Home / Self Care | Payer: Medicaid Other | Source: Ambulatory Visit | Attending: Orthopaedic Surgery | Admitting: Orthopaedic Surgery

## 2017-03-25 DIAGNOSIS — IMO0001 Reserved for inherently not codable concepts without codable children: Secondary | ICD-10-CM

## 2017-03-25 DIAGNOSIS — J449 Chronic obstructive pulmonary disease, unspecified: Secondary | ICD-10-CM

## 2017-03-25 DIAGNOSIS — M4850XA Collapsed vertebra, not elsewhere classified, site unspecified, initial encounter for fracture: Principal | ICD-10-CM

## 2017-03-26 NOTE — Progress Notes (Signed)
ATC, NA and no option to leave msg 

## 2017-04-08 ENCOUNTER — Ambulatory Visit (INDEPENDENT_AMBULATORY_CARE_PROVIDER_SITE_OTHER): Payer: Medicaid Other | Admitting: Orthopaedic Surgery

## 2017-06-04 ENCOUNTER — Encounter (INDEPENDENT_AMBULATORY_CARE_PROVIDER_SITE_OTHER): Payer: Self-pay | Admitting: Orthopaedic Surgery

## 2017-06-04 ENCOUNTER — Ambulatory Visit (INDEPENDENT_AMBULATORY_CARE_PROVIDER_SITE_OTHER): Payer: Medicaid Other | Admitting: Orthopaedic Surgery

## 2017-06-04 ENCOUNTER — Ambulatory Visit (INDEPENDENT_AMBULATORY_CARE_PROVIDER_SITE_OTHER): Payer: Self-pay | Admitting: Orthopaedic Surgery

## 2017-06-04 ENCOUNTER — Ambulatory Visit (INDEPENDENT_AMBULATORY_CARE_PROVIDER_SITE_OTHER): Payer: Medicaid Other

## 2017-06-04 DIAGNOSIS — S62645A Nondisplaced fracture of proximal phalanx of left ring finger, initial encounter for closed fracture: Secondary | ICD-10-CM

## 2017-06-04 MED ORDER — HYDROCODONE-ACETAMINOPHEN 5-325 MG PO TABS: 1.0000 | ORAL_TABLET | Freq: Two times a day (BID) | ORAL | 0 refills | Status: DC | PRN
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
## 2017-06-19 ENCOUNTER — Telehealth (INDEPENDENT_AMBULATORY_CARE_PROVIDER_SITE_OTHER): Payer: Self-pay | Admitting: Radiology

## 2017-06-19 NOTE — Telephone Encounter (Signed)
Primary care asked [facility] to call us to arrange an appt with us to get another splint.  I was not sure if the splint came from us or another place, can you call Patrice to advise/discuss appt needed for new splint?  [phone], ext [phone].

## 2017-06-19 NOTE — Telephone Encounter (Signed)
We buddy taped it.  I don't think we gave him a splint

## 2017-06-19 NOTE — Telephone Encounter (Signed)
Called Patrice and she states patient has a broken splint and patient is at  A facility now PH: [phone]. Do you know anything about this? Do I make appt for patient to come see us tomorrow?

## 2017-06-20 NOTE — Telephone Encounter (Signed)
MADE APPT FOR PATIENT

## 2017-06-25 ENCOUNTER — Ambulatory Visit (INDEPENDENT_AMBULATORY_CARE_PROVIDER_SITE_OTHER): Payer: Medicaid Other | Admitting: Orthopaedic Surgery

## 2017-06-25 ENCOUNTER — Ambulatory Visit (INDEPENDENT_AMBULATORY_CARE_PROVIDER_SITE_OTHER): Payer: Medicaid Other

## 2017-06-25 DIAGNOSIS — S62645A Nondisplaced fracture of proximal phalanx of left ring finger, initial encounter for closed fracture: Secondary | ICD-10-CM

## 2017-06-25 NOTE — Progress Notes (Signed)
Patient follows up today for his left small finger fracture.  He has been noncompliant with buddy taping.  He states that he is not able to move his finger.  He feels that he may have refractured or made it worse.  Clinical exam of the left hand is unchanged and benign.X-rays show stable alignment of the fracture with evidence of early callus formation.  There is no complications.  I gave him a written prescription for hand therapy for joint mobilization and strengthening.  Follow-up in 6 weeks with three-view x-rays of the left hand.

## 2017-07-05 ENCOUNTER — Ambulatory Visit (INDEPENDENT_AMBULATORY_CARE_PROVIDER_SITE_OTHER): Payer: Medicaid Other | Admitting: Orthopaedic Surgery

## 2017-11-04 ENCOUNTER — Emergency Department (HOSPITAL_COMMUNITY)
Admission: EM | Admit: 2017-11-04 | Discharge: 2017-11-05 | Disposition: A | Discharge status: Home / Self Care | Payer: Medicaid Other | Attending: Emergency Medicine | Admitting: Emergency Medicine

## 2017-11-04 ENCOUNTER — Encounter (HOSPITAL_COMMUNITY): Payer: Self-pay

## 2017-11-04 DIAGNOSIS — S0990XA Unspecified injury of head, initial encounter: Secondary | ICD-10-CM | POA: Diagnosis present

## 2017-11-04 DIAGNOSIS — F17200 Nicotine dependence, unspecified, uncomplicated: Secondary | ICD-10-CM | POA: Insufficient documentation

## 2017-11-04 DIAGNOSIS — Y9301 Activity, walking, marching and hiking: Secondary | ICD-10-CM | POA: Diagnosis not present

## 2017-11-04 DIAGNOSIS — Z7982 Long term (current) use of aspirin: Secondary | ICD-10-CM | POA: Insufficient documentation

## 2017-11-04 DIAGNOSIS — J449 Chronic obstructive pulmonary disease, unspecified: Secondary | ICD-10-CM | POA: Insufficient documentation

## 2017-11-04 DIAGNOSIS — S0101XA Laceration without foreign body of scalp, initial encounter: Secondary | ICD-10-CM | POA: Diagnosis not present

## 2017-11-04 DIAGNOSIS — Z79899 Other long term (current) drug therapy: Secondary | ICD-10-CM | POA: Diagnosis not present

## 2017-11-04 DIAGNOSIS — F1010 Alcohol abuse, uncomplicated: Secondary | ICD-10-CM

## 2017-11-04 DIAGNOSIS — Y999 Unspecified external cause status: Secondary | ICD-10-CM | POA: Diagnosis not present

## 2017-11-04 DIAGNOSIS — Z23 Encounter for immunization: Secondary | ICD-10-CM | POA: Insufficient documentation

## 2017-11-04 DIAGNOSIS — Y92410 Unspecified street and highway as the place of occurrence of the external cause: Secondary | ICD-10-CM | POA: Insufficient documentation

## 2017-11-04 MED ORDER — TETANUS-DIPHTH-ACELL PERTUSSIS 5-2.5-18.5 LF-MCG/0.5 IM SUSP
0.5000 mL | Freq: Once | INTRAMUSCULAR | Status: AC
  Administered 2017-11-05: 0.5 mL via INTRAMUSCULAR
  Filled 2017-11-04: qty 0.5

## 2017-11-04 MED ORDER — LIDOCAINE-EPINEPHRINE (PF) 2 %-1:200000 IJ SOLN
20.0000 mL | Freq: Once | INTRAMUSCULAR | Status: AC
  Administered 2017-11-05: 20 mL
  Filled 2017-11-04: qty 20

## 2017-11-04 MED ORDER — SODIUM CHLORIDE 0.9 % IV BOLUS (SEPSIS)
1000.0000 mL | Freq: Once | INTRAVENOUS | Status: AC
  Administered 2017-11-05: 1000 mL via INTRAVENOUS
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
## 2017-11-04 NOTE — ED Notes (Signed)
ED Provider at bedside. 

## 2017-11-05 ENCOUNTER — Emergency Department (HOSPITAL_COMMUNITY): Payer: Medicaid Other

## 2017-11-05 LAB — CBC WITH DIFFERENTIAL/PLATELET
BASOS ABS: 0 10*3/uL (ref 0.0–0.1)
BASOS PCT: 0 %
EOS ABS: 0.1 10*3/uL (ref 0.0–0.7)
Eosinophils Relative: 1 %
HEMATOCRIT: 34.8 % — AB (ref 39.0–52.0)
HEMOGLOBIN: 11.5 g/dL — AB (ref 13.0–17.0)
Lymphocytes Relative: 25 %
Lymphs Abs: 2.1 10*3/uL (ref 0.7–4.0)
MCH: 31 pg (ref 26.0–34.0)
MCHC: 33 g/dL (ref 30.0–36.0)
MCV: 93.8 fL (ref 78.0–100.0)
Monocytes Absolute: 0.4 10*3/uL (ref 0.1–1.0)
Monocytes Relative: 5 %
NEUTROS PCT: 69 %
Neutro Abs: 5.8 10*3/uL (ref 1.7–7.7)
Platelets: 216 10*3/uL (ref 150–400)
RBC: 3.71 MIL/uL — AB (ref 4.22–5.81)
RDW: 15.7 % — ABNORMAL HIGH (ref 11.5–15.5)
WBC: 8.4 10*3/uL (ref 4.0–10.5)

## 2017-11-05 LAB — COMPREHENSIVE METABOLIC PANEL
ALBUMIN: 3.5 g/dL (ref 3.5–5.0)
ALK PHOS: 74 U/L (ref 38–126)
ALT: 21 U/L (ref 17–63)
ANION GAP: 9 (ref 5–15)
AST: 31 U/L (ref 15–41)
BILIRUBIN TOTAL: 0.4 mg/dL (ref 0.3–1.2)
BUN: 5 mg/dL — AB (ref 6–20)
CALCIUM: 8.6 mg/dL — AB (ref 8.9–10.3)
CO2: 23 mmol/L (ref 22–32)
CREATININE: 0.68 mg/dL (ref 0.61–1.24)
Chloride: 110 mmol/L (ref 101–111)
GFR calc Af Amer: >60 mL/min (ref >60)
GFR calc non Af Amer: >60 mL/min (ref >60)
GLUCOSE: 110 mg/dL — AB (ref 65–99)
Potassium: 4 mmol/L (ref 3.5–5.1)
SODIUM: 142 mmol/L (ref 135–145)
TOTAL PROTEIN: 6 g/dL — AB (ref 6.5–8.1)

## 2017-11-05 LAB — ETHANOL: Alcohol, Ethyl (B): 164 mg/dL — ABNORMAL HIGH (ref <10)
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
## 2017-11-05 NOTE — ED Notes (Signed)
Pt stood with a walker without any problems

## 2017-12-12 ENCOUNTER — Ambulatory Visit (INDEPENDENT_AMBULATORY_CARE_PROVIDER_SITE_OTHER): Payer: Medicare Other | Admitting: Specialist

## 2017-12-12 ENCOUNTER — Ambulatory Visit (INDEPENDENT_AMBULATORY_CARE_PROVIDER_SITE_OTHER): Payer: Medicare Other

## 2017-12-12 ENCOUNTER — Encounter (INDEPENDENT_AMBULATORY_CARE_PROVIDER_SITE_OTHER): Payer: Self-pay | Admitting: Specialist

## 2017-12-12 ENCOUNTER — Telehealth (INDEPENDENT_AMBULATORY_CARE_PROVIDER_SITE_OTHER): Payer: Self-pay | Admitting: Specialist

## 2017-12-12 VITALS — BP 143/78 | HR 67 | Ht 68.0 in | Wt 135.0 lb

## 2017-12-12 DIAGNOSIS — M4804 Spinal stenosis, thoracic region: Secondary | ICD-10-CM

## 2017-12-12 DIAGNOSIS — S32010A Wedge compression fracture of first lumbar vertebra, initial encounter for closed fracture: Secondary | ICD-10-CM

## 2017-12-12 DIAGNOSIS — M4807 Spinal stenosis, lumbosacral region: Secondary | ICD-10-CM | POA: Diagnosis not present

## 2017-12-12 DIAGNOSIS — M545 Low back pain: Secondary | ICD-10-CM

## 2017-12-12 DIAGNOSIS — Z981 Arthrodesis status: Secondary | ICD-10-CM

## 2017-12-12 LAB — SEDIMENTATION RATE: SED RATE: 9 mm/h (ref 0–20)

## 2017-12-12 MED ORDER — GABAPENTIN 100 MG PO CAPS: ORAL_CAPSULE | ORAL | 0 refills | Status: AC

## 2017-12-12 MED ORDER — MELOXICAM 15 MG PO TABS: 15.0000 mg | ORAL_TABLET | Freq: Every day | ORAL | 3 refills | Status: AC

## 2017-12-12 MED ORDER — HYDROCODONE-ACETAMINOPHEN 5-325 MG PO TABS: 1.0000 | ORAL_TABLET | Freq: Two times a day (BID) | ORAL | 0 refills | Status: DC | PRN

## 2017-12-12 NOTE — Telephone Encounter (Signed)
Colleen with [facility] called needing clarification on the request to fill Rx (Hydrocodone) with codeine in it. Jill SideColleen said patient is allergic to codeine. Colleen advised she need approval from doctor to fill Rx. The number to contact Jill SideColleen is [phone]

## 2017-12-12 NOTE — Patient Instructions (Addendum)
Avoid bending, stooping and avoid lifting weights greater than 10 lbs. Avoid prolong standing and walking. Avoid frequent bending and stooping  No lifting greater than 10 lbs. May use ice or moist heat for pain. Weight loss is of benefit. Handicap license is approved. Exercise bicycle or pool walking for burning calories.  Primarily flexion exercises.  Start meloxicam for arthritis changes. Start hydrocodone twice a day for pain due to fractures Start Gabapentin for chronic pain and nerve pain.
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
## 2017-12-12 NOTE — Telephone Encounter (Signed)
Colleen with [facility] called needing clarification on the request to fill Rx (Hydrocodone) with codeine in it. Jill SideColleen said patient is allergic to codeine. Colleen advised she need approval from doctor to fill Rx.

## 2017-12-13 LAB — VITAMIN D 25 HYDROXY (VIT D DEFICIENCY, FRACTURES): VIT D 25 HYDROXY: 27 ng/mL — AB (ref 30–100)

## 2017-12-13 LAB — C-REACTIVE PROTEIN: CRP: 2 mg/L (ref <8.0)

## 2017-12-16 ENCOUNTER — Other Ambulatory Visit (INDEPENDENT_AMBULATORY_CARE_PROVIDER_SITE_OTHER): Payer: Self-pay | Admitting: Specialist

## 2017-12-16 DIAGNOSIS — M4807 Spinal stenosis, lumbosacral region: Secondary | ICD-10-CM

## 2017-12-16 NOTE — Telephone Encounter (Signed)
Jill Side called again to check on patients medication. She said he has been without since he has the allergy to codeine. Please advise# [phone]

## 2017-12-16 NOTE — Progress Notes (Signed)
Phone call to assisted living facility to verify patients medication list and allergies for myelogram procedure. Member of facility informed the patient will need to stop tramadol and zoloft 48hrs prior to myelogram appointment time. Member verbalized understanding.

## 2017-12-17 NOTE — Telephone Encounter (Signed)
I spoke with Dr. Otelia Sergeant, he states that pt was given Norco in 05/2017 without problems. He states that should be ok if not we can send in tramadol for him.  I called the pharm back and advised on this.

## 2017-12-26 ENCOUNTER — Ambulatory Visit
Admission: RE | Admit: 2017-12-26 | Discharge: 2017-12-26 | Disposition: A | Discharge status: Home / Self Care | Payer: Medicaid Other | Source: Ambulatory Visit | Attending: Specialist | Admitting: Specialist

## 2017-12-26 DIAGNOSIS — M4807 Spinal stenosis, lumbosacral region: Secondary | ICD-10-CM

## 2017-12-26 DIAGNOSIS — Z981 Arthrodesis status: Secondary | ICD-10-CM

## 2017-12-26 DIAGNOSIS — M4804 Spinal stenosis, thoracic region: Secondary | ICD-10-CM

## 2017-12-26 MED ORDER — DIAZEPAM 5 MG PO TABS
5.0000 mg | ORAL_TABLET | Freq: Once | ORAL | Status: AC
  Administered 2017-12-26: 10 mg via ORAL

## 2017-12-26 MED ORDER — IOPAMIDOL (ISOVUE-M 300) INJECTION 61%
10.0000 mL | Freq: Once | INTRAMUSCULAR | Status: AC | PRN
  Administered 2017-12-26: 10 mL via INTRATHECAL

## 2017-12-26 NOTE — Discharge Instructions (Addendum)
Myelogram Discharge Instructions  1. Go home and rest quietly for the next 24 hours.  It is important to lie flat for the next 24 hours.  Get up only to go to the restroom.  You may lie in the bed or on a couch on your back, your stomach, your left side or your right side.  You may have one pillow under your head.  You may have pillows between your knees while you are on your side or under your knees while you are on your back.  2. DO NOT drive today.  Recline the seat as far back as it will go, while still wearing your seat belt, on the way home.  3. You may get up to go to the bathroom as needed.  You may sit up for 10 minutes to eat.  You may resume your normal diet and medications unless otherwise indicated.  Drink plenty of extra fluids today and tomorrow.  4. The incidence of a spinal headache with nausea and/or vomiting is about 5% (one in 20 patients).  If you develop a headache, lie flat and drink plenty of fluids until the headache goes away.  Caffeinated beverages may be helpful.  If you develop severe nausea and vomiting or a headache that does not go away with flat bed rest, call [phone].  5. You may resume normal activities after your 24 hours of bed rest is over; however, do not exert yourself strongly or do any heavy lifting tomorrow.  6. Call your physician for a follow-up appointment.    You may resume Tramadol and Zoloft on Friday, Dec 27, 2017 after 9:30a.m.

## 2017-12-26 NOTE — Progress Notes (Signed)
Patient states he has been off Tramadol and Zoloft for at least the past two days.

## 2018-01-01 ENCOUNTER — Ambulatory Visit (INDEPENDENT_AMBULATORY_CARE_PROVIDER_SITE_OTHER): Payer: Medicaid Other | Admitting: Orthopaedic Surgery

## 2018-01-14 ENCOUNTER — Ambulatory Visit (INDEPENDENT_AMBULATORY_CARE_PROVIDER_SITE_OTHER): Payer: Medicare Other | Admitting: Specialist

## 2018-02-02 ENCOUNTER — Encounter (HOSPITAL_COMMUNITY): Payer: Self-pay

## 2018-02-02 ENCOUNTER — Emergency Department (HOSPITAL_COMMUNITY)
Admission: EM | Admit: 2018-02-02 | Discharge: 2018-02-02 | Disposition: A | Discharge status: Home / Self Care | Payer: Medicare Other | Attending: Emergency Medicine | Admitting: Emergency Medicine

## 2018-02-02 ENCOUNTER — Emergency Department (HOSPITAL_COMMUNITY): Payer: Medicare Other

## 2018-02-02 DIAGNOSIS — R413 Other amnesia: Secondary | ICD-10-CM | POA: Diagnosis not present

## 2018-02-02 DIAGNOSIS — Z79899 Other long term (current) drug therapy: Secondary | ICD-10-CM | POA: Diagnosis not present

## 2018-02-02 DIAGNOSIS — F17200 Nicotine dependence, unspecified, uncomplicated: Secondary | ICD-10-CM | POA: Insufficient documentation

## 2018-02-02 DIAGNOSIS — Z7982 Long term (current) use of aspirin: Secondary | ICD-10-CM | POA: Diagnosis not present

## 2018-02-02 DIAGNOSIS — R4182 Altered mental status, unspecified: Secondary | ICD-10-CM | POA: Diagnosis present

## 2018-02-02 DIAGNOSIS — J449 Chronic obstructive pulmonary disease, unspecified: Secondary | ICD-10-CM | POA: Insufficient documentation

## 2018-02-02 HISTORY — DX: Alcohol abuse, uncomplicated: F10.10

## 2018-02-02 LAB — COMPREHENSIVE METABOLIC PANEL
ALBUMIN: 3.5 g/dL (ref 3.5–5.0)
ALT: 34 U/L (ref 17–63)
AST: 75 U/L — AB (ref 15–41)
Alkaline Phosphatase: 77 U/L (ref 38–126)
Anion gap: 9 (ref 5–15)
BUN: 6 mg/dL (ref 6–20)
CHLORIDE: 109 mmol/L (ref 101–111)
CO2: 24 mmol/L (ref 22–32)
CREATININE: 1.1 mg/dL (ref 0.61–1.24)
Calcium: 9.6 mg/dL (ref 8.9–10.3)
GFR calc Af Amer: >60 mL/min (ref >60)
GFR calc non Af Amer: >60 mL/min (ref >60)
GLUCOSE: 106 mg/dL — AB (ref 65–99)
Potassium: 3.7 mmol/L (ref 3.5–5.1)
SODIUM: 142 mmol/L (ref 135–145)
Total Bilirubin: 0.5 mg/dL (ref 0.3–1.2)
Total Protein: 6.3 g/dL — ABNORMAL LOW (ref 6.5–8.1)

## 2018-02-02 LAB — URINALYSIS, ROUTINE W REFLEX MICROSCOPIC
Bacteria, UA: NONE SEEN
Bilirubin Urine: NEGATIVE
Glucose, UA: NEGATIVE mg/dL
HGB URINE DIPSTICK: NEGATIVE
Ketones, ur: NEGATIVE mg/dL
Nitrite: NEGATIVE
Protein, ur: NEGATIVE mg/dL
SPECIFIC GRAVITY, URINE: 1.005 (ref 1.005–1.030)
pH: 8 (ref 5.0–8.0)

## 2018-02-02 LAB — CBC WITH DIFFERENTIAL/PLATELET
ABS IMMATURE GRANULOCYTES: 0 10*3/uL (ref 0.0–0.1)
BASOS ABS: 0 10*3/uL (ref 0.0–0.1)
BASOS PCT: 0 %
Eosinophils Absolute: 0.1 10*3/uL (ref 0.0–0.7)
Eosinophils Relative: 1 %
HCT: 33.3 % — ABNORMAL LOW (ref 39.0–52.0)
Hemoglobin: 11 g/dL — ABNORMAL LOW (ref 13.0–17.0)
IMMATURE GRANULOCYTES: 0 %
LYMPHS PCT: 18 %
Lymphs Abs: 1.8 10*3/uL (ref 0.7–4.0)
MCH: 31.3 pg (ref 26.0–34.0)
MCHC: 33 g/dL (ref 30.0–36.0)
MCV: 94.6 fL (ref 78.0–100.0)
Monocytes Absolute: 0.6 10*3/uL (ref 0.1–1.0)
Monocytes Relative: 6 %
NEUTROS ABS: 7.1 10*3/uL (ref 1.7–7.7)
NEUTROS PCT: 75 %
PLATELETS: 177 10*3/uL (ref 150–400)
RBC: 3.52 MIL/uL — ABNORMAL LOW (ref 4.22–5.81)
RDW: 15.3 % (ref 11.5–15.5)
WBC: 9.6 10*3/uL (ref 4.0–10.5)

## 2018-02-02 LAB — RAPID URINE DRUG SCREEN, HOSP PERFORMED
AMPHETAMINES: NOT DETECTED
BENZODIAZEPINES: NOT DETECTED
Cocaine: NOT DETECTED
Opiates: NOT DETECTED
Tetrahydrocannabinol: POSITIVE — AB

## 2018-02-02 LAB — ETHANOL

## 2018-02-02 LAB — TROPONIN I: Troponin I: <0.03 ng/mL (ref <0.03)

## 2018-02-02 LAB — VALPROIC ACID LEVEL
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
## 2018-02-02 NOTE — ED Notes (Signed)
[facility]

## 2018-02-02 NOTE — ED Notes (Signed)
Pt understood dc material. NAD noted. [facility] Dan HumphreysWalker was transported with him. NAD noted

## 2018-02-02 NOTE — ED Triage Notes (Signed)
[facility]. Pt has hx of ETOH abuse, last drink x10 days ago. LNW 1430 today, facility states pt left to go to the store and came back confused with difficulty remembering the last few days. Pt states he does remember going to work Friday and also remembers leaving the facility to go the store. Pt c/o dizziness, has hx of vertigo. Per EMS pt passed their stroke screen. Pt A+Ox3, disoriented to time. Pt in NAD.

## 2018-02-02 NOTE — Discharge Instructions (Addendum)
Make an appointment to follow-up with a neurologist and your primary physician.  Return for worsening symptoms or concerns.

## 2018-02-02 NOTE — ED Notes (Signed)
Called [facility] to transport patient @ 20:54

## 2018-04-15 ENCOUNTER — Encounter (HOSPITAL_COMMUNITY): Payer: Self-pay | Admitting: Emergency Medicine

## 2018-04-15 ENCOUNTER — Emergency Department (HOSPITAL_COMMUNITY): Payer: Medicare Other

## 2018-04-15 ENCOUNTER — Emergency Department (HOSPITAL_COMMUNITY)
Admission: EM | Admit: 2018-04-15 | Discharge: 2018-04-16 | Disposition: A | Discharge status: Home / Self Care | Payer: Medicare Other | Attending: Emergency Medicine | Admitting: Emergency Medicine

## 2018-04-15 DIAGNOSIS — R0789 Other chest pain: Secondary | ICD-10-CM | POA: Insufficient documentation

## 2018-04-15 DIAGNOSIS — W19XXXA Unspecified fall, initial encounter: Secondary | ICD-10-CM | POA: Diagnosis not present

## 2018-04-15 DIAGNOSIS — Z79899 Other long term (current) drug therapy: Secondary | ICD-10-CM | POA: Diagnosis not present

## 2018-04-15 DIAGNOSIS — F17210 Nicotine dependence, cigarettes, uncomplicated: Secondary | ICD-10-CM | POA: Diagnosis not present

## 2018-04-15 DIAGNOSIS — R0602 Shortness of breath: Secondary | ICD-10-CM | POA: Insufficient documentation

## 2018-04-15 DIAGNOSIS — J449 Chronic obstructive pulmonary disease, unspecified: Secondary | ICD-10-CM | POA: Insufficient documentation

## 2018-04-15 DIAGNOSIS — Z7982 Long term (current) use of aspirin: Secondary | ICD-10-CM | POA: Insufficient documentation

## 2018-04-15 LAB — I-STAT TROPONIN, ED
TROPONIN I, POC: 0 ng/mL (ref 0.00–0.08)
TROPONIN I, POC: 0.01 ng/mL (ref 0.00–0.08)

## 2018-04-15 LAB — BASIC METABOLIC PANEL
Anion gap: 7 (ref 5–15)
BUN: 16 mg/dL (ref 8–23)
CALCIUM: 8.4 mg/dL — AB (ref 8.9–10.3)
CHLORIDE: 109 mmol/L (ref 98–111)
CO2: 24 mmol/L (ref 22–32)
CREATININE: 0.82 mg/dL (ref 0.61–1.24)
Glucose, Bld: 104 mg/dL — ABNORMAL HIGH (ref 70–99)
Potassium: 3.9 mmol/L (ref 3.5–5.1)
SODIUM: 140 mmol/L (ref 135–145)

## 2018-04-15 LAB — CBC
HCT: 30.5 % — ABNORMAL LOW (ref 39.0–52.0)
HEMOGLOBIN: 10 g/dL — AB (ref 13.0–17.0)
MCH: 32.4 pg (ref 26.0–34.0)
MCHC: 32.8 g/dL (ref 30.0–36.0)
MCV: 98.7 fL (ref 78.0–100.0)
PLATELETS: 187 10*3/uL (ref 150–400)
RBC: 3.09 MIL/uL — ABNORMAL LOW (ref 4.22–5.81)
RDW: 14.2 % (ref 11.5–15.5)
WBC: 9.9 10*3/uL (ref 4.0–10.5)

## 2018-04-15 LAB — ETHANOL

## 2018-04-15 MED ORDER — ASPIRIN 81 MG PO CHEW
324.0000 mg | CHEWABLE_TABLET | Freq: Once | ORAL | Status: AC
  Administered 2018-04-15: 324 mg via ORAL
  Filled 2018-04-15: qty 4

## 2018-04-15 MED ORDER — SODIUM CHLORIDE 0.9 % IV BOLUS
1000.0000 mL | Freq: Once | INTRAVENOUS | Status: AC
  Administered 2018-04-15: 1000 mL via INTRAVENOUS

## 2018-04-15 MED ORDER — SODIUM CHLORIDE 0.9 % IV SOLN
INTRAVENOUS | Status: DC
  Administered 2018-04-15: 18:00:00 via INTRAVENOUS

## 2018-04-15 NOTE — ED Triage Notes (Signed)
Pt to ER for evaluation of left substernal chest pain onset 45 min prior to arrival. Per EMS, patient is resident at group home and has been reportedly "lost and wondering, group home hasn't been able to find him." EMS reports giving patient 1 nitro and 324 mg aspirin. Pt in NAD on arrival, BP 90/50. BP on arrival 112/68.
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
## 2018-04-15 NOTE — Discharge Instructions (Addendum)
Take over-the-counter medications as needed for pain, follow-up with your primary care doctor as needed,

## 2018-04-28 ENCOUNTER — Emergency Department (HOSPITAL_COMMUNITY)
Admission: EM | Admit: 2018-04-28 | Discharge: 2018-04-28 | Disposition: A | Discharge status: Home / Self Care | Payer: Medicare Other | Attending: Emergency Medicine | Admitting: Emergency Medicine

## 2018-04-28 ENCOUNTER — Encounter (HOSPITAL_COMMUNITY): Payer: Self-pay | Admitting: *Deleted

## 2018-04-28 DIAGNOSIS — F17210 Nicotine dependence, cigarettes, uncomplicated: Secondary | ICD-10-CM | POA: Insufficient documentation

## 2018-04-28 DIAGNOSIS — Y999 Unspecified external cause status: Secondary | ICD-10-CM | POA: Diagnosis not present

## 2018-04-28 DIAGNOSIS — Y929 Unspecified place or not applicable: Secondary | ICD-10-CM | POA: Diagnosis not present

## 2018-04-28 DIAGNOSIS — S59912A Unspecified injury of left forearm, initial encounter: Secondary | ICD-10-CM | POA: Diagnosis present

## 2018-04-28 DIAGNOSIS — Y939 Activity, unspecified: Secondary | ICD-10-CM | POA: Insufficient documentation

## 2018-04-28 DIAGNOSIS — I10 Essential (primary) hypertension: Secondary | ICD-10-CM | POA: Insufficient documentation

## 2018-04-28 DIAGNOSIS — Z79899 Other long term (current) drug therapy: Secondary | ICD-10-CM | POA: Diagnosis not present

## 2018-04-28 DIAGNOSIS — M545 Low back pain: Secondary | ICD-10-CM | POA: Insufficient documentation

## 2018-04-28 DIAGNOSIS — W1830XA Fall on same level, unspecified, initial encounter: Secondary | ICD-10-CM | POA: Diagnosis not present

## 2018-04-28 DIAGNOSIS — J449 Chronic obstructive pulmonary disease, unspecified: Secondary | ICD-10-CM | POA: Insufficient documentation

## 2018-04-28 DIAGNOSIS — W19XXXA Unspecified fall, initial encounter: Secondary | ICD-10-CM

## 2018-04-28 DIAGNOSIS — S50812A Abrasion of left forearm, initial encounter: Secondary | ICD-10-CM

## 2018-04-28 HISTORY — DX: Essential (primary) hypertension: I10

## 2018-04-28 MED ORDER — ACETAMINOPHEN 325 MG PO TABS
650.0000 mg | ORAL_TABLET | Freq: Once | ORAL | Status: AC
  Administered 2018-04-28: 650 mg via ORAL
  Filled 2018-04-28: qty 2
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
## 2018-04-28 NOTE — ED Notes (Signed)
Bed: ZW25 Expected date:  Expected time:  Means of arrival:  Comments: EMS fall, trip on walker
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
## 2019-09-04 ENCOUNTER — Other Ambulatory Visit: Payer: Self-pay | Admitting: Critical Care Medicine

## 2019-09-04 DIAGNOSIS — Z20822 Contact with and (suspected) exposure to covid-19: Secondary | ICD-10-CM

## 2019-09-05 LAB — NOVEL CORONAVIRUS, NAA: SARS-CoV-2, NAA: NOT DETECTED

## 2019-09-25 ENCOUNTER — Other Ambulatory Visit: Payer: Self-pay

## 2019-09-25 DIAGNOSIS — Z20822 Contact with and (suspected) exposure to covid-19: Secondary | ICD-10-CM

## 2019-09-27 LAB — NOVEL CORONAVIRUS, NAA: SARS-CoV-2, NAA: NOT DETECTED

## 2019-10-16 ENCOUNTER — Other Ambulatory Visit: Payer: Self-pay

## 2019-10-16 DIAGNOSIS — Z20822 Contact with and (suspected) exposure to covid-19: Secondary | ICD-10-CM

## 2019-10-17 LAB — NOVEL CORONAVIRUS, NAA: SARS-CoV-2, NAA: NOT DETECTED

## 2019-10-20 ENCOUNTER — Other Ambulatory Visit: Payer: Self-pay

## 2019-10-20 ENCOUNTER — Emergency Department (HOSPITAL_COMMUNITY)
Admission: EM | Admit: 2019-10-20 | Discharge: 2019-10-20 | Disposition: A | Discharge status: Home / Self Care | Payer: Medicare Other | Attending: Emergency Medicine | Admitting: Emergency Medicine

## 2019-10-20 ENCOUNTER — Emergency Department (HOSPITAL_COMMUNITY): Payer: Medicare Other

## 2019-10-20 DIAGNOSIS — R29898 Other symptoms and signs involving the musculoskeletal system: Secondary | ICD-10-CM | POA: Diagnosis not present

## 2019-10-20 DIAGNOSIS — J449 Chronic obstructive pulmonary disease, unspecified: Secondary | ICD-10-CM | POA: Insufficient documentation

## 2019-10-20 DIAGNOSIS — I10 Essential (primary) hypertension: Secondary | ICD-10-CM | POA: Insufficient documentation

## 2019-10-20 DIAGNOSIS — R042 Hemoptysis: Secondary | ICD-10-CM | POA: Insufficient documentation

## 2019-10-20 DIAGNOSIS — R1013 Epigastric pain: Secondary | ICD-10-CM | POA: Diagnosis not present

## 2019-10-20 DIAGNOSIS — Z79899 Other long term (current) drug therapy: Secondary | ICD-10-CM | POA: Diagnosis not present

## 2019-10-20 DIAGNOSIS — F17210 Nicotine dependence, cigarettes, uncomplicated: Secondary | ICD-10-CM | POA: Diagnosis not present

## 2019-10-20 LAB — URINALYSIS, ROUTINE W REFLEX MICROSCOPIC
Bilirubin Urine: NEGATIVE
Glucose, UA: NEGATIVE mg/dL
Hgb urine dipstick: NEGATIVE
Ketones, ur: 80 mg/dL — AB
Leukocytes,Ua: NEGATIVE
Nitrite: NEGATIVE
Protein, ur: NEGATIVE mg/dL
Specific Gravity, Urine: 1.029 (ref 1.005–1.030)
pH: 6 (ref 5.0–8.0)

## 2019-10-20 LAB — ETHANOL: Alcohol, Ethyl (B): <10 mg/dL (ref <10)

## 2019-10-20 LAB — COMPREHENSIVE METABOLIC PANEL
ALT: 19 U/L (ref 0–44)
AST: 34 U/L (ref 15–41)
Albumin: 3.9 g/dL (ref 3.5–5.0)
Alkaline Phosphatase: 69 U/L (ref 38–126)
Anion gap: 12 (ref 5–15)
BUN: 9 mg/dL (ref 8–23)
CO2: 23 mmol/L (ref 22–32)
Calcium: 9.3 mg/dL (ref 8.9–10.3)
Chloride: 103 mmol/L (ref 98–111)
Creatinine, Ser: 0.89 mg/dL (ref 0.61–1.24)
GFR calc Af Amer: >60 mL/min (ref >60)
GFR calc non Af Amer: >60 mL/min (ref >60)
Glucose, Bld: 154 mg/dL — ABNORMAL HIGH (ref 70–99)
Potassium: 4.4 mmol/L (ref 3.5–5.1)
Sodium: 138 mmol/L (ref 135–145)
Total Bilirubin: 0.8 mg/dL (ref 0.3–1.2)
Total Protein: 7.1 g/dL (ref 6.5–8.1)

## 2019-10-20 LAB — CBC WITH DIFFERENTIAL/PLATELET
Abs Immature Granulocytes: 0.06 10*3/uL (ref 0.00–0.07)
Basophils Absolute: 0 10*3/uL (ref 0.0–0.1)
Basophils Relative: 0 %
Eosinophils Absolute: 0 10*3/uL (ref 0.0–0.5)
Eosinophils Relative: 0 %
HCT: 40.7 % (ref 39.0–52.0)
Hemoglobin: 13.2 g/dL (ref 13.0–17.0)
Immature Granulocytes: 1 %
Lymphocytes Relative: 11 %
Lymphs Abs: 1.2 10*3/uL (ref 0.7–4.0)
MCH: 29.9 pg (ref 26.0–34.0)
MCHC: 32.4 g/dL (ref 30.0–36.0)
MCV: 92.3 fL (ref 80.0–100.0)
Monocytes Absolute: 0.8 10*3/uL (ref 0.1–1.0)
Monocytes Relative: 8 %
Neutro Abs: 8.1 10*3/uL — ABNORMAL HIGH (ref 1.7–7.7)
Neutrophils Relative %: 80 %
Platelets: 349 10*3/uL (ref 150–400)
RBC: 4.41 MIL/uL (ref 4.22–5.81)
RDW: 15.4 % (ref 11.5–15.5)
WBC: 10.2 10*3/uL (ref 4.0–10.5)
nRBC: 0 % (ref 0.0–0.2)

## 2019-10-20 LAB — LIPASE, BLOOD: Lipase: 18 U/L (ref 11–51)

## 2019-10-20 MED ORDER — SODIUM CHLORIDE 0.9 % IV BOLUS
1000.0000 mL | Freq: Once | INTRAVENOUS | Status: AC
  Administered 2019-10-20: 17:00:00 1000 mL via INTRAVENOUS

## 2019-10-20 MED ORDER — KETOROLAC TROMETHAMINE 30 MG/ML IJ SOLN
15.0000 mg | Freq: Once | INTRAMUSCULAR | Status: AC
  Administered 2019-10-20: 17:00:00 15 mg via INTRAVENOUS
  Filled 2019-10-20: qty 1

## 2019-10-20 MED ORDER — FAMOTIDINE 20 MG PO TABS: 20.0000 mg | ORAL_TABLET | Freq: Two times a day (BID) | ORAL | 0 refills | Status: AC

## 2019-10-20 MED ORDER — ONDANSETRON HCL 4 MG/2ML IJ SOLN
4.0000 mg | Freq: Once | INTRAMUSCULAR | Status: AC
  Administered 2019-10-20: 14:00:00 4 mg via INTRAVENOUS
  Filled 2019-10-20: qty 2

## 2019-10-20 MED ORDER — LIDOCAINE VISCOUS HCL 2 % MT SOLN
15.0000 mL | Freq: Once | OROMUCOSAL | Status: AC
  Administered 2019-10-20: 16:00:00 15 mL via ORAL
  Filled 2019-10-20: qty 15

## 2019-10-20 MED ORDER — IOHEXOL 300 MG/ML  SOLN
100.0000 mL | Freq: Once | INTRAMUSCULAR | Status: AC | PRN
  Administered 2019-10-20: 15:00:00 100 mL via INTRAVENOUS

## 2019-10-20 MED ORDER — ALUM & MAG HYDROXIDE-SIMETH 200-200-20 MG/5ML PO SUSP
30.0000 mL | Freq: Once | ORAL | Status: AC
  Administered 2019-10-20: 30 mL via ORAL
  Filled 2019-10-20: qty 30

## 2019-10-20 MED ORDER — SODIUM CHLORIDE 0.9 % IV SOLN
80.0000 mg | Freq: Once | INTRAVENOUS | Status: AC
  Administered 2019-10-20: 80 mg via INTRAVENOUS
  Filled 2019-10-20: qty 80

## 2019-10-20 MED ORDER — SUCRALFATE 1 G PO TABS: 1.0000 g | ORAL_TABLET | Freq: Three times a day (TID) | ORAL | 0 refills | Status: AC

## 2019-10-20 NOTE — Discharge Instructions (Signed)
1. Medications: Start taking Pepcid twice daily with meals.  Take Carafate with meals and at night for the next week. 2. Treatment: rest, drink plenty of fluids, advance diet slowly.  Eat a diet of bland foods that will not upset your stomach.  Avoid spicy foods, acidic foods, fried foods, or alcohol. 3. Follow Up: Please followup with your Chales Abrahams Placey at the [facility] tomorrow for discussion of your diagnoses and further evaluation after today's visit; she can fill your prescriptions and make sure you have regular follow-up; Please return to the ER for persistent vomiting, high fevers or worsening symptoms

## 2019-10-20 NOTE — Discharge Planning (Signed)
Oletta Cohn, RN, BSN, [place] [phone] Pt qualifies for DME wheelchair.  DME  ordered through [employer].  [facility] of [facility] notified to deliver wheelchair to pt room prior to D/C home.
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
## 2019-10-20 NOTE — Care Management (Signed)
..........      Durable Medical Equipment  (From admission, onward)         Start     Ordered   10/20/19 1614  For home use only DME lightweight manual wheelchair with seat cushion  Once    Comments: Patient suffers from lower extremity weakness which impairs their ability to perform daily activities like bathing and feeding in the home.  A walker will not resolve  issue with performing activities of daily living. A wheelchair will allow patient to safely perform daily activities. Patient is not able to propel themselves in the home using a standard weight wheelchair due to general weakness. Patient can self propel in the lightweight wheelchair. Length of need Lifetime. Accessories: elevating leg rests (ELRs), wheel locks, extensions and anti-tippers.   10/20/19 1614

## 2019-10-20 NOTE — ED Triage Notes (Signed)
Pt homeless brought in via EMS for streaks of blood in sputum, N/V, and tremors. Pt states he feels weak and off balance, unable to walk with walker as usual.

## 2019-10-26 ENCOUNTER — Encounter (HOSPITAL_COMMUNITY): Payer: Self-pay | Admitting: Emergency Medicine

## 2019-10-26 ENCOUNTER — Other Ambulatory Visit: Payer: Self-pay

## 2019-10-26 ENCOUNTER — Emergency Department (HOSPITAL_COMMUNITY): Payer: Medicare Other

## 2019-10-26 ENCOUNTER — Emergency Department (HOSPITAL_COMMUNITY)
Admission: EM | Admit: 2019-10-26 | Discharge: 2019-10-26 | Disposition: A | Discharge status: Home / Self Care | Payer: Medicare Other | Attending: Emergency Medicine | Admitting: Emergency Medicine

## 2019-10-26 ENCOUNTER — Ambulatory Visit (HOSPITAL_COMMUNITY)
Admission: EM | Admit: 2019-10-26 | Discharge: 2019-10-26 | Discharge status: Against Medical Advice | Payer: Medicare Other | Source: Home / Self Care | Attending: Family Medicine | Admitting: Family Medicine

## 2019-10-26 DIAGNOSIS — I10 Essential (primary) hypertension: Secondary | ICD-10-CM | POA: Insufficient documentation

## 2019-10-26 DIAGNOSIS — J449 Chronic obstructive pulmonary disease, unspecified: Secondary | ICD-10-CM | POA: Diagnosis not present

## 2019-10-26 DIAGNOSIS — Z79899 Other long term (current) drug therapy: Secondary | ICD-10-CM | POA: Insufficient documentation

## 2019-10-26 DIAGNOSIS — M545 Low back pain, unspecified: Secondary | ICD-10-CM

## 2019-10-26 DIAGNOSIS — F17210 Nicotine dependence, cigarettes, uncomplicated: Secondary | ICD-10-CM | POA: Insufficient documentation

## 2019-10-26 MED ORDER — ACETAMINOPHEN 500 MG PO TABS
1000.0000 mg | ORAL_TABLET | Freq: Once | ORAL | Status: AC
  Administered 2019-10-26: 1000 mg via ORAL
  Filled 2019-10-26: qty 2

## 2019-10-26 MED ORDER — DIAZEPAM 2 MG PO TABS
2.0000 mg | ORAL_TABLET | Freq: Once | ORAL | Status: AC
  Administered 2019-10-26: 2 mg via ORAL
  Filled 2019-10-26: qty 1

## 2019-10-26 MED ORDER — KETOROLAC TROMETHAMINE 15 MG/ML IJ SOLN
15.0000 mg | Freq: Once | INTRAMUSCULAR | Status: AC
  Administered 2019-10-26: 15 mg via INTRAMUSCULAR
  Filled 2019-10-26: qty 1

## 2019-10-26 NOTE — ED Triage Notes (Signed)
Per [facility] pt homeless c/o back pain and vertigo. Was seen at [facility] ED on 3/2. Pt has personal wheelchair.  Vitals: 130/72, 88HR, 98% on RA, 98.6 temp. 18R.

## 2019-10-26 NOTE — Discharge Instructions (Signed)
Take 4 over the counter ibuprofen tablets 3 times a day or 2 over-the-counter naproxen tablets twice a day for pain. Also take tylenol 1000mg(2 extra strength) four times a day.    
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
## 2019-10-30 ENCOUNTER — Other Ambulatory Visit: Payer: Self-pay

## 2019-10-30 ENCOUNTER — Emergency Department (HOSPITAL_COMMUNITY)
Admission: EM | Admit: 2019-10-30 | Discharge: 2019-10-30 | Disposition: A | Discharge status: Home / Self Care | Payer: Medicare Other | Attending: Emergency Medicine | Admitting: Emergency Medicine

## 2019-10-30 ENCOUNTER — Encounter (HOSPITAL_COMMUNITY): Payer: Self-pay

## 2019-10-30 ENCOUNTER — Emergency Department (HOSPITAL_COMMUNITY): Payer: Medicare Other

## 2019-10-30 DIAGNOSIS — J449 Chronic obstructive pulmonary disease, unspecified: Secondary | ICD-10-CM | POA: Diagnosis not present

## 2019-10-30 DIAGNOSIS — Y929 Unspecified place or not applicable: Secondary | ICD-10-CM | POA: Insufficient documentation

## 2019-10-30 DIAGNOSIS — Y9389 Activity, other specified: Secondary | ICD-10-CM | POA: Diagnosis not present

## 2019-10-30 DIAGNOSIS — I10 Essential (primary) hypertension: Secondary | ICD-10-CM | POA: Insufficient documentation

## 2019-10-30 DIAGNOSIS — Y998 Other external cause status: Secondary | ICD-10-CM | POA: Insufficient documentation

## 2019-10-30 DIAGNOSIS — F17210 Nicotine dependence, cigarettes, uncomplicated: Secondary | ICD-10-CM | POA: Insufficient documentation

## 2019-10-30 DIAGNOSIS — W010XXA Fall on same level from slipping, tripping and stumbling without subsequent striking against object, initial encounter: Secondary | ICD-10-CM | POA: Insufficient documentation

## 2019-10-30 DIAGNOSIS — M545 Low back pain, unspecified: Secondary | ICD-10-CM

## 2019-10-30 DIAGNOSIS — S80211A Abrasion, right knee, initial encounter: Secondary | ICD-10-CM

## 2019-10-30 DIAGNOSIS — Z79899 Other long term (current) drug therapy: Secondary | ICD-10-CM | POA: Diagnosis not present

## 2019-10-30 DIAGNOSIS — S8991XA Unspecified injury of right lower leg, initial encounter: Secondary | ICD-10-CM | POA: Diagnosis present

## 2019-10-30 DIAGNOSIS — W19XXXA Unspecified fall, initial encounter: Secondary | ICD-10-CM

## 2019-10-30 MED ORDER — ACETAMINOPHEN 500 MG PO TABS
500.0000 mg | ORAL_TABLET | Freq: Once | ORAL | Status: AC
  Administered 2019-10-30: 500 mg via ORAL
  Filled 2019-10-30: qty 1

## 2019-10-30 MED ORDER — BACITRACIN ZINC 500 UNIT/GM EX OINT
TOPICAL_OINTMENT | Freq: Two times a day (BID) | CUTANEOUS | Status: DC
  Filled 2019-10-30: qty 0.9

## 2019-10-30 MED ORDER — LIDOCAINE 5 % EX PTCH: 1.0000 | MEDICATED_PATCH | CUTANEOUS | Status: DC

## 2019-10-30 NOTE — ED Notes (Signed)
Patient refusing xray of knee. Patient states he wants back xrays. Notified provider.

## 2019-10-30 NOTE — Discharge Instructions (Addendum)
You have been diagnosed today with Fall, Abrasion of Right Knee, Left-Sided Low Back Pain.  At this time there does not appear to be the presence of an emergent medical condition, however there is always the potential for conditions to change. Please read and follow the below instructions.  Please return to the Emergency Department immediately for any new or worsening symptoms Please be sure to follow up with your Primary Care Provider within one week regarding your visit today; please call their office to schedule an appointment even if you are feeling better for a follow-up visit. Please change your bandage regularly and keep the area clean.  Wash gently with clean soapy water twice daily and monitor for signs of infection.  If signs of infection develop return to the ER immediately. Your x-ray today showed compression fractures of your T11 vertebrae and L1 vertebrae along with osseous demineralization and spondylosis.  Please discuss these findings with your primary care provider.  You may also follow-up with the spine specialist Dr. Franky Macho on your discharge paperwork for reevaluation.  Get help right away if: You have weakness or numbness in one or both of your legs or feet. You have trouble controlling your bladder or your bowels. You have nausea or vomiting. You have pain in your abdomen. You have shortness of breath or you faint. You have a red streak going away from your wound. You have a fever. You have fluid, blood, or pus coming from your wound. There is a bad smell coming from your wound or bandage. You have any new/concerning or worsening of symptoms  Please read the additional information packets attached to your discharge summary.  Do not take your medicine if  develop an itchy rash, swelling in your mouth or lips, or difficulty breathing; call 911 and seek immediate emergency medical attention if this occurs.  Note: Portions of this text may have been transcribed using voice  recognition software. Every effort was made to ensure accuracy; however, inadvertent computerized transcription errors may still be present.

## 2019-10-30 NOTE — ED Triage Notes (Signed)
Pt presents with c/o bilateral leg pain. Per EMS, PD is coming to be with pt and [facility] upon discharge.

## 2019-10-30 NOTE — ED Provider Notes (Signed)
  Face-to-face evaluation   History: She presents for evaluation of leg pain.  Was evaluated in the ED, 4 days ago after a fall.  He has difficulty walking has progressed from walker to wheelchair, over the last several weeks.  Physical exam: Disheveled, male who is alert and cooperative.  Mild abrasions right knee, with no swelling or effusion.  Normal range of motion both legs.  Complains of pain in the left lumbar region where there is no visible injury.  He demonstrates normal lumbar motion while sitting in a chair.  Medical screening examination/treatment/procedure(s) were conducted as a shared visit with non-physician practitioner(s) and myself.  I personally evaluated the patient during the encounter    Mancel Bale, MD 10/31/19 1036
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
## 2019-11-01 ENCOUNTER — Other Ambulatory Visit: Payer: Self-pay

## 2019-11-01 ENCOUNTER — Emergency Department (HOSPITAL_COMMUNITY)
Admission: EM | Admit: 2019-11-01 | Discharge: 2019-11-01 | Disposition: A | Discharge status: Home / Self Care | Payer: Medicare Other | Attending: Emergency Medicine | Admitting: Emergency Medicine

## 2019-11-01 ENCOUNTER — Encounter (HOSPITAL_COMMUNITY): Payer: Self-pay

## 2019-11-01 ENCOUNTER — Emergency Department (HOSPITAL_COMMUNITY): Payer: Medicare Other

## 2019-11-01 DIAGNOSIS — G8929 Other chronic pain: Secondary | ICD-10-CM | POA: Insufficient documentation

## 2019-11-01 DIAGNOSIS — F17200 Nicotine dependence, unspecified, uncomplicated: Secondary | ICD-10-CM | POA: Insufficient documentation

## 2019-11-01 DIAGNOSIS — M79605 Pain in left leg: Secondary | ICD-10-CM | POA: Diagnosis present

## 2019-11-01 DIAGNOSIS — Z79899 Other long term (current) drug therapy: Secondary | ICD-10-CM | POA: Insufficient documentation

## 2019-11-01 DIAGNOSIS — M25552 Pain in left hip: Secondary | ICD-10-CM | POA: Diagnosis not present

## 2019-11-01 DIAGNOSIS — I10 Essential (primary) hypertension: Secondary | ICD-10-CM | POA: Insufficient documentation

## 2019-11-01 DIAGNOSIS — J449 Chronic obstructive pulmonary disease, unspecified: Secondary | ICD-10-CM | POA: Insufficient documentation

## 2019-11-01 LAB — CBC
HCT: 34.1 % — ABNORMAL LOW (ref 39.0–52.0)
Hemoglobin: 11 g/dL — ABNORMAL LOW (ref 13.0–17.0)
MCH: 30.1 pg (ref 26.0–34.0)
MCHC: 32.3 g/dL (ref 30.0–36.0)
MCV: 93.4 fL (ref 80.0–100.0)
Platelets: 318 10*3/uL (ref 150–400)
RBC: 3.65 MIL/uL — ABNORMAL LOW (ref 4.22–5.81)
RDW: 15.9 % — ABNORMAL HIGH (ref 11.5–15.5)
WBC: 8.9 10*3/uL (ref 4.0–10.5)
nRBC: 0 % (ref 0.0–0.2)

## 2019-11-01 LAB — BASIC METABOLIC PANEL
Anion gap: 10 (ref 5–15)
BUN: 12 mg/dL (ref 8–23)
CO2: 23 mmol/L (ref 22–32)
Calcium: 8.9 mg/dL (ref 8.9–10.3)
Chloride: 105 mmol/L (ref 98–111)
Creatinine, Ser: 0.66 mg/dL (ref 0.61–1.24)
GFR calc Af Amer: >60 mL/min (ref >60)
GFR calc non Af Amer: >60 mL/min (ref >60)
Glucose, Bld: 104 mg/dL — ABNORMAL HIGH (ref 70–99)
Potassium: 3.8 mmol/L (ref 3.5–5.1)
Sodium: 138 mmol/L (ref 135–145)

## 2019-11-01 MED ORDER — ACETAMINOPHEN 325 MG PO TABS
650.0000 mg | ORAL_TABLET | Freq: Once | ORAL | Status: AC
  Administered 2019-11-01: 16:00:00 650 mg via ORAL
  Filled 2019-11-01: qty 2

## 2019-11-01 NOTE — Care Management (Signed)
Patient was seen in the [facility] ED a week ago c/o of bilateral leg swelling difficulty walking, a wheel chair was ordered prior to discharge.  Patient will not be eligible for another w/c by Medicare. Spoke with [facility] CSW about obtaining possibly a rolling walker

## 2019-11-01 NOTE — ED Notes (Signed)
Patient stated that he hasn't eaten in 2 days and requested some food before he gets d/c. Patient given sandwich and some graham crackers at this time. Will discharge patient after he finishes eating.

## 2019-11-01 NOTE — Discharge Instructions (Addendum)
Schedule an appointment with a primary care doctor, try calling [facility]

## 2019-11-01 NOTE — Progress Notes (Signed)
CSW spoke with Keon with [employer] who is processing pt's insurance info to see if pt can obtain or not obtain another rolling walker.  CSW standing by to hear from [facility] in regards to the outcome.  CSW will continue to follow for D/C needs.  Dorothe Pea. Dorion Petillo, LCSW, LCAS, CSI Transitions of Care Clinical Social Worker Care Coordination Department Ph: [phone]

## 2019-11-01 NOTE — ED Notes (Signed)
PT expressed he wanted a hot meal, has not eaten in 2 days.
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
## 2019-11-03 ENCOUNTER — Other Ambulatory Visit: Payer: Self-pay

## 2019-11-03 ENCOUNTER — Emergency Department (HOSPITAL_COMMUNITY): Payer: Medicare Other

## 2019-11-03 ENCOUNTER — Emergency Department (HOSPITAL_COMMUNITY)
Admission: EM | Admit: 2019-11-03 | Discharge: 2019-11-03 | Disposition: A | Discharge status: Home / Self Care | Payer: Medicare Other | Attending: Emergency Medicine | Admitting: Emergency Medicine

## 2019-11-03 ENCOUNTER — Encounter (HOSPITAL_COMMUNITY): Payer: Self-pay | Admitting: Emergency Medicine

## 2019-11-03 DIAGNOSIS — Z79899 Other long term (current) drug therapy: Secondary | ICD-10-CM | POA: Insufficient documentation

## 2019-11-03 DIAGNOSIS — M79609 Pain in unspecified limb: Secondary | ICD-10-CM | POA: Diagnosis present

## 2019-11-03 DIAGNOSIS — R531 Weakness: Secondary | ICD-10-CM

## 2019-11-03 DIAGNOSIS — J449 Chronic obstructive pulmonary disease, unspecified: Secondary | ICD-10-CM | POA: Insufficient documentation

## 2019-11-03 DIAGNOSIS — Z590 Homelessness: Secondary | ICD-10-CM | POA: Insufficient documentation

## 2019-11-03 DIAGNOSIS — I10 Essential (primary) hypertension: Secondary | ICD-10-CM | POA: Insufficient documentation

## 2019-11-03 DIAGNOSIS — F17210 Nicotine dependence, cigarettes, uncomplicated: Secondary | ICD-10-CM | POA: Diagnosis not present

## 2019-11-03 DIAGNOSIS — G8194 Hemiplegia, unspecified affecting left nondominant side: Secondary | ICD-10-CM | POA: Diagnosis not present

## 2019-11-03 LAB — BASIC METABOLIC PANEL
Anion gap: 12 (ref 5–15)
BUN: 11 mg/dL (ref 8–23)
CO2: 26 mmol/L (ref 22–32)
Calcium: 9.2 mg/dL (ref 8.9–10.3)
Chloride: 104 mmol/L (ref 98–111)
Creatinine, Ser: 1.01 mg/dL (ref 0.61–1.24)
GFR calc Af Amer: >60 mL/min (ref >60)
GFR calc non Af Amer: >60 mL/min (ref >60)
Glucose, Bld: 165 mg/dL — ABNORMAL HIGH (ref 70–99)
Potassium: 4.1 mmol/L (ref 3.5–5.1)
Sodium: 142 mmol/L (ref 135–145)

## 2019-11-03 LAB — CBC
HCT: 37 % — ABNORMAL LOW (ref 39.0–52.0)
Hemoglobin: 11.9 g/dL — ABNORMAL LOW (ref 13.0–17.0)
MCH: 30 pg (ref 26.0–34.0)
MCHC: 32.2 g/dL (ref 30.0–36.0)
MCV: 93.2 fL (ref 80.0–100.0)
Platelets: 357 10*3/uL (ref 150–400)
RBC: 3.97 MIL/uL — ABNORMAL LOW (ref 4.22–5.81)
RDW: 15.9 % — ABNORMAL HIGH (ref 11.5–15.5)
WBC: 9.2 10*3/uL (ref 4.0–10.5)
nRBC: 0 % (ref 0.0–0.2)

## 2019-11-03 LAB — URINALYSIS, ROUTINE W REFLEX MICROSCOPIC
Bilirubin Urine: NEGATIVE
Glucose, UA: NEGATIVE mg/dL
Hgb urine dipstick: NEGATIVE
Ketones, ur: NEGATIVE mg/dL
Nitrite: NEGATIVE
Protein, ur: NEGATIVE mg/dL
Specific Gravity, Urine: 1.02 (ref 1.005–1.030)
pH: 6 (ref 5.0–8.0)

## 2019-11-03 LAB — CBG MONITORING, ED: Glucose-Capillary: 151 mg/dL — ABNORMAL HIGH (ref 70–99)

## 2019-11-03 MED ORDER — TRAMADOL HCL 50 MG PO TABS
50.0000 mg | ORAL_TABLET | Freq: Once | ORAL | Status: AC
  Administered 2019-11-03: 18:00:00 50 mg via ORAL
  Filled 2019-11-03: qty 1

## 2019-11-03 MED ORDER — SODIUM CHLORIDE 0.9% FLUSH: 3.0000 mL | Freq: Once | INTRAVENOUS | Status: DC
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
## 2019-11-03 NOTE — ED Triage Notes (Signed)
Pt arrives to ED with PTAR with complaints of generalized weakness for the last couple of weeks that has worsened recently. Patient states he is trying to stay at the [facility] but they won't take him because he cannot walk and is too weak. Patient states he has not been eating or drinking and has been sleeping outside.

## 2019-11-03 NOTE — Discharge Instructions (Addendum)
Return here as needed. Follow up at the [facility] °

## 2019-11-03 NOTE — ED Notes (Signed)
Patient verbalizes understanding of discharge instructions. Opportunity for questioning and answers were provided.  All questions answered completely. Armband removed by staff, pt discharged from ED. Pt wheeled from ED with wheelchair provided to him by social work and all belongings

## 2019-11-03 NOTE — ED Notes (Signed)
Pt at bedside

## 2019-11-03 NOTE — Evaluation (Signed)
Physical Therapy Evaluation Patient Details Name: Jesse Aguilar MRN: [id number] DOB: 10/09/1950 Today's Date: 11/03/2019   History of Present Illness  Pt is a 69 y/o male presenting to the ED following falls and inability to walk. PMH includes L ORIF and pt reported hx of vertigo.   Clinical Impression  Pt admitted secondary to problem above with deficits below. Pt requiring min to mod A to take side steps at EOB. Pt unsteady and presenting with LLE pain. Pt currently homeless and has had multiple falls. Feel pt would benefit from SNF level therapies at d/c to increase independence and safety. Will continue to follow acutely.     Follow Up Recommendations SNF    Equipment Recommendations  None recommended by PT    Recommendations for Other Services       Precautions / Restrictions Precautions Precautions: Fall Precaution Comments: Reports multiple falls.  Restrictions Weight Bearing Restrictions: No      Mobility  Bed Mobility Overal bed mobility: Needs Assistance Bed Mobility: Supine to Sit;Sit to Supine     Supine to sit: Min assist Sit to supine: Mod assist   General bed mobility comments: Min A for trunk elevation to come to sitting. Mod A for LE assist to return to supine.   Transfers Overall transfer level: Needs assistance Equipment used: 1 person hand held assist Transfers: Sit to/from Stand Sit to Stand: Min assist         General transfer comment: Min A for steadying assist.   Ambulation/Gait Ambulation/Gait assistance: Mod assist   Assistive device: 1 person hand held assist       General Gait Details: Took side steps at EOB. Pt with increased difficulty placing weight on LLE secondary to pain. Also reports dizziness, however, BP WFL.   Stairs            Wheelchair Mobility    Modified Rankin (Stroke Patients Only)       Balance Overall balance assessment: Needs assistance Sitting-balance support: No upper extremity supported;Feet  supported Sitting balance-Leahy Scale: Fair     Standing balance support: Single extremity supported;During functional activity Standing balance-Leahy Scale: Poor Standing balance comment: Reliant on UE and external support                              Pertinent Vitals/Pain Pain Assessment: Faces Faces Pain Scale: Hurts even more Pain Location: L hip  Pain Descriptors / Indicators: Aching;Grimacing;Guarding Pain Intervention(s): Limited activity within patient's tolerance;Monitored during session;Repositioned    Home Living Family/patient expects to be discharged to:: Skilled nursing facility                 Additional Comments: Currently homeless    Prior Function Level of Independence: Independent with assistive device(s)         Comments: Reports using RW, however, reports multiple falls secondadry to dizziness.      Hand Dominance        Extremity/Trunk Assessment   Upper Extremity Assessment Upper Extremity Assessment: Generalized weakness    Lower Extremity Assessment Lower Extremity Assessment: Generalized weakness;LLE deficits/detail LLE Deficits / Details: Hip pain in LLE which limited ROM.     Cervical / Trunk Assessment Cervical / Trunk Assessment: Normal  Communication   Communication: No difficulties  Cognition Arousal/Alertness: Awake/alert Behavior During Therapy: WFL for tasks assessed/performed Overall Cognitive Status: No family/caregiver present to determine baseline cognitive functioning  General Comments General comments (skin integrity, edema, etc.): Had pt perform horizontal and vertical gaze; did not note any nystagmus.     Exercises     Assessment/Plan    PT Assessment Patient needs continued PT services  PT Problem List Decreased strength;Decreased activity tolerance;Decreased balance;Decreased mobility;Decreased knowledge of use of DME;Decreased knowledge  of precautions;Pain       PT Treatment Interventions Gait training;DME instruction;Stair training;Functional mobility training;Therapeutic activities;Therapeutic exercise;Balance training;Patient/family education    PT Goals (Current goals can be found in the Care Plan section)  Acute Rehab PT Goals Patient Stated Goal: to go to rehab to get stronger and more stable on feet PT Goal Formulation: With patient Time For Goal Achievement: 11/17/19 Potential to Achieve Goals: Good    Frequency Min 2X/week   Barriers to discharge Inaccessible home environment;Decreased caregiver support      Co-evaluation               AM-PAC PT "6 Clicks" Mobility  Outcome Measure Help needed turning from your back to your side while in a flat bed without using bedrails?: A Little Help needed moving from lying on your back to sitting on the side of a flat bed without using bedrails?: A Little Help needed moving to and from a bed to a chair (including a wheelchair)?: A Lot Help needed standing up from a chair using your arms (e.g., wheelchair or bedside chair)?: A Little Help needed to walk in hospital room?: A Lot Help needed climbing 3-5 steps with a railing? : A Lot 6 Click Score: 15    End of Session Equipment Utilized During Treatment: Gait belt Activity Tolerance: Patient limited by pain;Treatment limited secondary to medical complications (Comment)(dizziness) Patient left: in bed;with call bell/phone within reach Nurse Communication: Mobility status PT Visit Diagnosis: Unsteadiness on feet (R26.81);History of falling (Z91.81);Repeated falls (R29.6);Muscle weakness (generalized) (M62.81);Other (comment)    Time: 1650-1706 PT Time Calculation (min) (ACUTE ONLY): 16 min   Charges:   PT Evaluation $PT Eval Moderate Complexity: 1 Mod          Jesse Aguilar, PT, DPT  Acute Rehabilitation Services  Pager: [phone] Office: [phone]   Rudean Hitt 11/03/2019,  6:22 PM

## 2019-11-03 NOTE — ED Notes (Signed)
Pt aware that we need urine specimen. 
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
## 2019-11-04 ENCOUNTER — Telehealth: Payer: Self-pay | Admitting: *Deleted

## 2019-11-04 ENCOUNTER — Encounter: Payer: Self-pay | Admitting: *Deleted
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
## 2019-11-04 NOTE — Telephone Encounter (Signed)
Set up transportation for appointment on 11/09/19 with Jovita Kussmaul. Client to be transported from [facility].
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
## 2019-11-13 ENCOUNTER — Emergency Department (HOSPITAL_COMMUNITY): Payer: Medicare Other

## 2019-11-13 ENCOUNTER — Encounter (HOSPITAL_COMMUNITY): Payer: Self-pay | Admitting: *Deleted

## 2019-11-13 ENCOUNTER — Other Ambulatory Visit: Payer: Self-pay

## 2019-11-13 ENCOUNTER — Emergency Department (HOSPITAL_COMMUNITY)
Admission: EM | Admit: 2019-11-13 | Discharge: 2019-11-13 | Disposition: A | Discharge status: Home / Self Care | Payer: Medicare Other | Attending: Emergency Medicine | Admitting: Emergency Medicine

## 2019-11-13 DIAGNOSIS — F17210 Nicotine dependence, cigarettes, uncomplicated: Secondary | ICD-10-CM | POA: Diagnosis not present

## 2019-11-13 DIAGNOSIS — R42 Dizziness and giddiness: Secondary | ICD-10-CM | POA: Insufficient documentation

## 2019-11-13 DIAGNOSIS — I10 Essential (primary) hypertension: Secondary | ICD-10-CM | POA: Insufficient documentation

## 2019-11-13 DIAGNOSIS — S3992XA Unspecified injury of lower back, initial encounter: Secondary | ICD-10-CM | POA: Diagnosis present

## 2019-11-13 DIAGNOSIS — Y939 Activity, unspecified: Secondary | ICD-10-CM | POA: Insufficient documentation

## 2019-11-13 DIAGNOSIS — W19XXXA Unspecified fall, initial encounter: Secondary | ICD-10-CM | POA: Insufficient documentation

## 2019-11-13 DIAGNOSIS — Z79899 Other long term (current) drug therapy: Secondary | ICD-10-CM | POA: Diagnosis not present

## 2019-11-13 DIAGNOSIS — Y929 Unspecified place or not applicable: Secondary | ICD-10-CM | POA: Insufficient documentation

## 2019-11-13 DIAGNOSIS — Y999 Unspecified external cause status: Secondary | ICD-10-CM | POA: Insufficient documentation

## 2019-11-13 DIAGNOSIS — Z590 Homelessness: Secondary | ICD-10-CM | POA: Diagnosis not present

## 2019-11-13 DIAGNOSIS — S39012A Strain of muscle, fascia and tendon of lower back, initial encounter: Secondary | ICD-10-CM | POA: Insufficient documentation

## 2019-11-13 MED ORDER — IBUPROFEN 200 MG PO TABS
600.0000 mg | ORAL_TABLET | Freq: Once | ORAL | Status: AC
  Administered 2019-11-13: 21:00:00 600 mg via ORAL
  Filled 2019-11-13: qty 3
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
## 2019-11-13 NOTE — ED Triage Notes (Addendum)
Pt BIB EMS and presents with left hip pain that started about an hour ago. Pt denied any injuiries to EMS. Pt reports Hx of hip and vertigo to EMS.  EMS observed pt ambulating at the scene.  Pt has wheelchair at bedside.  Pt a/o x 4.

## 2019-11-14 ENCOUNTER — Emergency Department (HOSPITAL_COMMUNITY)
Admission: EM | Admit: 2019-11-14 | Discharge: 2019-11-14 | Disposition: A | Discharge status: Home / Self Care | Payer: Medicare Other | Attending: Emergency Medicine | Admitting: Emergency Medicine

## 2019-11-14 ENCOUNTER — Emergency Department (HOSPITAL_COMMUNITY): Payer: Medicare Other

## 2019-11-14 DIAGNOSIS — W19XXXA Unspecified fall, initial encounter: Secondary | ICD-10-CM | POA: Insufficient documentation

## 2019-11-14 DIAGNOSIS — Y999 Unspecified external cause status: Secondary | ICD-10-CM | POA: Diagnosis not present

## 2019-11-14 DIAGNOSIS — Y9389 Activity, other specified: Secondary | ICD-10-CM | POA: Insufficient documentation

## 2019-11-14 DIAGNOSIS — Y92511 Restaurant or cafe as the place of occurrence of the external cause: Secondary | ICD-10-CM | POA: Insufficient documentation

## 2019-11-14 DIAGNOSIS — S7002XA Contusion of left hip, initial encounter: Secondary | ICD-10-CM | POA: Diagnosis not present

## 2019-11-14 DIAGNOSIS — Z79899 Other long term (current) drug therapy: Secondary | ICD-10-CM | POA: Diagnosis not present

## 2019-11-14 DIAGNOSIS — J449 Chronic obstructive pulmonary disease, unspecified: Secondary | ICD-10-CM | POA: Insufficient documentation

## 2019-11-14 DIAGNOSIS — I10 Essential (primary) hypertension: Secondary | ICD-10-CM | POA: Diagnosis not present

## 2019-11-14 DIAGNOSIS — R42 Dizziness and giddiness: Secondary | ICD-10-CM | POA: Insufficient documentation

## 2019-11-14 DIAGNOSIS — F17200 Nicotine dependence, unspecified, uncomplicated: Secondary | ICD-10-CM | POA: Diagnosis not present

## 2019-11-14 DIAGNOSIS — S79912A Unspecified injury of left hip, initial encounter: Secondary | ICD-10-CM | POA: Diagnosis present

## 2019-11-14 MED ORDER — IBUPROFEN 800 MG PO TABS
800.0000 mg | ORAL_TABLET | Freq: Once | ORAL | Status: AC
  Administered 2019-11-14: 800 mg via ORAL
  Filled 2019-11-14: qty 1
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
## 2019-11-14 NOTE — Discharge Instructions (Addendum)
X-rays showed no fractured hip.  You are stable for discharge.

## 2019-11-19 ENCOUNTER — Emergency Department (HOSPITAL_COMMUNITY)
Admission: EM | Admit: 2019-11-19 | Discharge: 2019-11-19 | Disposition: A | Discharge status: Other Acute Inpatient Hospital | Payer: Medicare Other

## 2019-11-19 ENCOUNTER — Encounter (HOSPITAL_COMMUNITY): Payer: Self-pay | Admitting: Emergency Medicine

## 2019-11-19 NOTE — ED Triage Notes (Signed)
Per EMS-states his vertigo is bothering him

## 2019-11-20 ENCOUNTER — Encounter (HOSPITAL_COMMUNITY): Payer: Self-pay

## 2019-11-20 ENCOUNTER — Other Ambulatory Visit: Payer: Self-pay

## 2019-11-20 ENCOUNTER — Emergency Department (HOSPITAL_COMMUNITY)
Admission: EM | Admit: 2019-11-20 | Discharge: 2019-11-20 | Disposition: A | Discharge status: Home / Self Care | Payer: Medicare Other | Attending: Emergency Medicine | Admitting: Emergency Medicine

## 2019-11-20 ENCOUNTER — Emergency Department (HOSPITAL_COMMUNITY): Payer: Medicare Other

## 2019-11-20 DIAGNOSIS — Y9289 Other specified places as the place of occurrence of the external cause: Secondary | ICD-10-CM | POA: Diagnosis not present

## 2019-11-20 DIAGNOSIS — F17210 Nicotine dependence, cigarettes, uncomplicated: Secondary | ICD-10-CM | POA: Insufficient documentation

## 2019-11-20 DIAGNOSIS — Y999 Unspecified external cause status: Secondary | ICD-10-CM | POA: Diagnosis not present

## 2019-11-20 DIAGNOSIS — W19XXXA Unspecified fall, initial encounter: Secondary | ICD-10-CM | POA: Diagnosis not present

## 2019-11-20 DIAGNOSIS — Y9389 Activity, other specified: Secondary | ICD-10-CM | POA: Diagnosis not present

## 2019-11-20 DIAGNOSIS — M25559 Pain in unspecified hip: Secondary | ICD-10-CM | POA: Diagnosis present

## 2019-11-20 DIAGNOSIS — J449 Chronic obstructive pulmonary disease, unspecified: Secondary | ICD-10-CM | POA: Insufficient documentation

## 2019-11-20 DIAGNOSIS — M545 Low back pain, unspecified: Secondary | ICD-10-CM

## 2019-11-20 DIAGNOSIS — Z79899 Other long term (current) drug therapy: Secondary | ICD-10-CM | POA: Insufficient documentation

## 2019-11-20 DIAGNOSIS — I10 Essential (primary) hypertension: Secondary | ICD-10-CM | POA: Insufficient documentation
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
## 2019-11-20 NOTE — ED Notes (Signed)
Pt has been ambulating in room. He states he can not walk when told he was being discharged. Pt was reminded he walked in with EMS. Pt given a cane for assistance to get back to location he was picked up at.Security escorting pt out of facility

## 2019-11-20 NOTE — Discharge Instructions (Signed)
Your x-rays do not show any broken bones.

## 2019-11-20 NOTE — ED Triage Notes (Addendum)
Patient c/o left hip pain and states he fell approx 2.5 hours ago.

## 2019-11-20 NOTE — ED Notes (Signed)
Pt has completed xrays

## 2019-11-22 ENCOUNTER — Emergency Department (HOSPITAL_COMMUNITY): Payer: Medicare Other

## 2019-11-22 ENCOUNTER — Emergency Department (HOSPITAL_COMMUNITY)
Admission: EM | Admit: 2019-11-22 | Discharge: 2019-11-22 | Disposition: A | Discharge status: Home / Self Care | Payer: Medicare Other | Attending: Emergency Medicine | Admitting: Emergency Medicine

## 2019-11-22 ENCOUNTER — Encounter (HOSPITAL_COMMUNITY): Payer: Self-pay | Admitting: Emergency Medicine

## 2019-11-22 ENCOUNTER — Other Ambulatory Visit: Payer: Self-pay

## 2019-11-22 DIAGNOSIS — I10 Essential (primary) hypertension: Secondary | ICD-10-CM | POA: Diagnosis not present

## 2019-11-22 DIAGNOSIS — F17210 Nicotine dependence, cigarettes, uncomplicated: Secondary | ICD-10-CM | POA: Insufficient documentation

## 2019-11-22 DIAGNOSIS — Z043 Encounter for examination and observation following other accident: Secondary | ICD-10-CM | POA: Diagnosis not present

## 2019-11-22 DIAGNOSIS — M545 Low back pain, unspecified: Secondary | ICD-10-CM

## 2019-11-22 DIAGNOSIS — Z79899 Other long term (current) drug therapy: Secondary | ICD-10-CM | POA: Insufficient documentation

## 2019-11-22 DIAGNOSIS — J449 Chronic obstructive pulmonary disease, unspecified: Secondary | ICD-10-CM | POA: Insufficient documentation

## 2019-11-22 DIAGNOSIS — M549 Dorsalgia, unspecified: Secondary | ICD-10-CM | POA: Diagnosis present

## 2019-11-22 LAB — I-STAT CHEM 8, ED
BUN: 13 mg/dL (ref 8–23)
Calcium, Ion: 1.18 mmol/L (ref 1.15–1.40)
Chloride: 102 mmol/L (ref 98–111)
Creatinine, Ser: 0.7 mg/dL (ref 0.61–1.24)
Glucose, Bld: 105 mg/dL — ABNORMAL HIGH (ref 70–99)
HCT: 36 % — ABNORMAL LOW (ref 39.0–52.0)
Hemoglobin: 12.2 g/dL — ABNORMAL LOW (ref 13.0–17.0)
Potassium: 3.6 mmol/L (ref 3.5–5.1)
Sodium: 140 mmol/L (ref 135–145)
TCO2: 29 mmol/L (ref 22–32)

## 2019-11-22 MED ORDER — IOHEXOL 300 MG/ML  SOLN
100.0000 mL | Freq: Once | INTRAMUSCULAR | Status: AC | PRN
  Administered 2019-11-22: 11:00:00 100 mL via INTRAVENOUS
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
## 2019-11-22 NOTE — Discharge Instructions (Addendum)
Your CT scans did not show any abnormalities today.  Return here as needed.

## 2019-11-23 ENCOUNTER — Emergency Department (HOSPITAL_COMMUNITY)
Admission: EM | Admit: 2019-11-23 | Discharge: 2019-11-23 | Disposition: A | Discharge status: Home / Self Care | Payer: Medicare Other | Attending: Emergency Medicine | Admitting: Emergency Medicine

## 2019-11-23 ENCOUNTER — Other Ambulatory Visit: Payer: Self-pay

## 2019-11-23 ENCOUNTER — Encounter (HOSPITAL_COMMUNITY): Payer: Self-pay | Admitting: Emergency Medicine

## 2019-11-23 DIAGNOSIS — Z5321 Procedure and treatment not carried out due to patient leaving prior to being seen by health care provider: Secondary | ICD-10-CM | POA: Insufficient documentation

## 2019-11-23 DIAGNOSIS — R42 Dizziness and giddiness: Secondary | ICD-10-CM | POA: Insufficient documentation

## 2019-11-23 NOTE — ED Notes (Signed)
Pt called 3 times, no answer 

## 2019-11-23 NOTE — ED Triage Notes (Signed)
Pt arrives via EMS with reports of vertigo and falling today. States he has not had much water today. Pt would like social work to help with access to food and water.

## 2019-11-24 ENCOUNTER — Encounter (HOSPITAL_COMMUNITY): Payer: Self-pay

## 2019-11-24 ENCOUNTER — Other Ambulatory Visit: Payer: Self-pay

## 2019-11-24 ENCOUNTER — Emergency Department (HOSPITAL_COMMUNITY)
Admission: EM | Admit: 2019-11-24 | Discharge: 2019-11-24 | Disposition: A | Discharge status: Home / Self Care | Payer: Medicare Other | Attending: Emergency Medicine | Admitting: Emergency Medicine

## 2019-11-24 DIAGNOSIS — R42 Dizziness and giddiness: Secondary | ICD-10-CM | POA: Insufficient documentation

## 2019-11-24 DIAGNOSIS — Z5321 Procedure and treatment not carried out due to patient leaving prior to being seen by health care provider: Secondary | ICD-10-CM | POA: Diagnosis not present

## 2019-11-24 NOTE — ED Notes (Signed)
Patient called x3 with no answer. 

## 2019-11-24 NOTE — ED Notes (Signed)
Called for room placement x1 with no answer. 

## 2019-11-24 NOTE — ED Notes (Signed)
Called for room placement x 2 with no answer.

## 2019-11-24 NOTE — ED Triage Notes (Signed)
Pt BIB EMS from [place]. Pt c/o vertigo, pt stated he felt dizzy. Pt seen yesterday at [facility] for similar symptoms. Pt denies fall or trauma.

## 2019-11-26 ENCOUNTER — Encounter (HOSPITAL_COMMUNITY): Payer: Self-pay | Admitting: Emergency Medicine

## 2019-11-26 ENCOUNTER — Emergency Department (HOSPITAL_COMMUNITY)
Admission: EM | Admit: 2019-11-26 | Discharge: 2019-11-26 | Disposition: A | Discharge status: Home / Self Care | Payer: Medicare Other | Attending: Emergency Medicine | Admitting: Emergency Medicine

## 2019-11-26 DIAGNOSIS — R42 Dizziness and giddiness: Secondary | ICD-10-CM | POA: Insufficient documentation

## 2019-11-26 DIAGNOSIS — Z5321 Procedure and treatment not carried out due to patient leaving prior to being seen by health care provider: Secondary | ICD-10-CM | POA: Diagnosis not present

## 2019-11-26 LAB — CBC
HCT: 36.1 % — ABNORMAL LOW (ref 39.0–52.0)
Hemoglobin: 11.3 g/dL — ABNORMAL LOW (ref 13.0–17.0)
MCH: 29.6 pg (ref 26.0–34.0)
MCHC: 31.3 g/dL (ref 30.0–36.0)
MCV: 94.5 fL (ref 80.0–100.0)
Platelets: 344 10*3/uL (ref 150–400)
RBC: 3.82 MIL/uL — ABNORMAL LOW (ref 4.22–5.81)
RDW: 16.1 % — ABNORMAL HIGH (ref 11.5–15.5)
WBC: 8.7 10*3/uL (ref 4.0–10.5)
nRBC: 0 % (ref 0.0–0.2)

## 2019-11-26 LAB — COMPREHENSIVE METABOLIC PANEL
ALT: 20 U/L (ref 0–44)
AST: 28 U/L (ref 15–41)
Albumin: 3.4 g/dL — ABNORMAL LOW (ref 3.5–5.0)
Alkaline Phosphatase: 75 U/L (ref 38–126)
Anion gap: 4 — ABNORMAL LOW (ref 5–15)
BUN: 9 mg/dL (ref 8–23)
CO2: 25 mmol/L (ref 22–32)
Calcium: 8.7 mg/dL — ABNORMAL LOW (ref 8.9–10.3)
Chloride: 112 mmol/L — ABNORMAL HIGH (ref 98–111)
Creatinine, Ser: 0.9 mg/dL (ref 0.61–1.24)
GFR calc Af Amer: >60 mL/min (ref >60)
GFR calc non Af Amer: >60 mL/min (ref >60)
Glucose, Bld: 114 mg/dL — ABNORMAL HIGH (ref 70–99)
Potassium: 3.6 mmol/L (ref 3.5–5.1)
Sodium: 141 mmol/L (ref 135–145)
Total Bilirubin: 0.2 mg/dL — ABNORMAL LOW (ref 0.3–1.2)
Total Protein: 6.5 g/dL (ref 6.5–8.1)

## 2019-11-26 NOTE — ED Triage Notes (Signed)
Per EMS-complaining of vertigo-chronic issue-has been seen multiple times for same symptoms-last seem 4/6

## 2019-11-26 NOTE — ED Notes (Signed)
Pt not seen in ER room. Took off all of the leads and took his belongings with him. Assume pt has eloped.

## 2019-11-27 ENCOUNTER — Emergency Department (HOSPITAL_COMMUNITY)
Admission: EM | Admit: 2019-11-27 | Discharge: 2019-11-27 | Discharge status: Absent without leave | Payer: Medicare Other

## 2019-11-28 ENCOUNTER — Other Ambulatory Visit: Payer: Self-pay

## 2019-11-28 ENCOUNTER — Encounter (HOSPITAL_COMMUNITY): Payer: Self-pay | Admitting: *Deleted

## 2019-11-28 ENCOUNTER — Emergency Department (HOSPITAL_COMMUNITY)
Admission: EM | Admit: 2019-11-28 | Discharge: 2019-11-29 | Disposition: A | Discharge status: Home / Self Care | Payer: Medicare Other | Attending: Emergency Medicine | Admitting: Emergency Medicine

## 2019-11-28 DIAGNOSIS — F17200 Nicotine dependence, unspecified, uncomplicated: Secondary | ICD-10-CM | POA: Diagnosis not present

## 2019-11-28 DIAGNOSIS — M545 Low back pain, unspecified: Secondary | ICD-10-CM

## 2019-11-28 DIAGNOSIS — I10 Essential (primary) hypertension: Secondary | ICD-10-CM | POA: Diagnosis not present

## 2019-11-28 DIAGNOSIS — W01198A Fall on same level from slipping, tripping and stumbling with subsequent striking against other object, initial encounter: Secondary | ICD-10-CM | POA: Diagnosis not present

## 2019-11-28 DIAGNOSIS — J449 Chronic obstructive pulmonary disease, unspecified: Secondary | ICD-10-CM | POA: Diagnosis not present

## 2019-11-28 DIAGNOSIS — Z79899 Other long term (current) drug therapy: Secondary | ICD-10-CM | POA: Diagnosis not present

## 2019-11-28 NOTE — ED Triage Notes (Signed)
Per EMS, pt has hx of vertigo and fell 3 times today. Complains of back pain

## 2019-11-29 ENCOUNTER — Emergency Department (HOSPITAL_COMMUNITY): Payer: Medicare Other

## 2019-11-29 DIAGNOSIS — M545 Low back pain: Secondary | ICD-10-CM | POA: Diagnosis not present

## 2019-11-29 MED ORDER — ACETAMINOPHEN 325 MG PO TABS
650.0000 mg | ORAL_TABLET | Freq: Once | ORAL | Status: AC
  Administered 2019-11-29: 650 mg via ORAL
  Filled 2019-11-29: qty 2
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
## 2019-11-29 NOTE — Discharge Instructions (Addendum)
You can take acetaminophen for pain.  Recheck as needed.

## 2020-01-23 ENCOUNTER — Other Ambulatory Visit (HOSPITAL_BASED_OUTPATIENT_CLINIC_OR_DEPARTMENT_OTHER): Payer: Self-pay | Admitting: Nurse Practitioner

## 2020-01-23 DIAGNOSIS — K219 Gastro-esophageal reflux disease without esophagitis: Secondary | ICD-10-CM

## 2020-02-05 ENCOUNTER — Ambulatory Visit (HOSPITAL_BASED_OUTPATIENT_CLINIC_OR_DEPARTMENT_OTHER): Payer: Medicare Other

## 2020-02-24 ENCOUNTER — Emergency Department (HOSPITAL_COMMUNITY)
Admission: EM | Admit: 2020-02-24 | Discharge: 2020-02-24 | Disposition: A | Discharge status: Home / Self Care | Payer: Medicare Other | Attending: Emergency Medicine | Admitting: Emergency Medicine

## 2020-02-24 ENCOUNTER — Emergency Department (HOSPITAL_COMMUNITY): Payer: Medicare Other

## 2020-02-24 ENCOUNTER — Encounter (HOSPITAL_COMMUNITY): Payer: Self-pay

## 2020-02-24 DIAGNOSIS — Z79899 Other long term (current) drug therapy: Secondary | ICD-10-CM | POA: Diagnosis not present

## 2020-02-24 DIAGNOSIS — F1410 Cocaine abuse, uncomplicated: Secondary | ICD-10-CM

## 2020-02-24 DIAGNOSIS — R55 Syncope and collapse: Secondary | ICD-10-CM | POA: Insufficient documentation

## 2020-02-24 DIAGNOSIS — F17210 Nicotine dependence, cigarettes, uncomplicated: Secondary | ICD-10-CM | POA: Diagnosis not present

## 2020-02-24 DIAGNOSIS — J449 Chronic obstructive pulmonary disease, unspecified: Secondary | ICD-10-CM | POA: Insufficient documentation

## 2020-02-24 DIAGNOSIS — R109 Unspecified abdominal pain: Secondary | ICD-10-CM | POA: Diagnosis not present

## 2020-02-24 DIAGNOSIS — I10 Essential (primary) hypertension: Secondary | ICD-10-CM | POA: Diagnosis not present

## 2020-02-24 DIAGNOSIS — E162 Hypoglycemia, unspecified: Secondary | ICD-10-CM | POA: Diagnosis not present

## 2020-02-24 DIAGNOSIS — R112 Nausea with vomiting, unspecified: Secondary | ICD-10-CM | POA: Diagnosis not present

## 2020-02-24 LAB — RAPID URINE DRUG SCREEN, HOSP PERFORMED
Amphetamines: NOT DETECTED
Barbiturates: NOT DETECTED
Benzodiazepines: NOT DETECTED
Cocaine: POSITIVE — AB
Opiates: NOT DETECTED
Tetrahydrocannabinol: POSITIVE — AB

## 2020-02-24 LAB — URINALYSIS, ROUTINE W REFLEX MICROSCOPIC
Bilirubin Urine: NEGATIVE
Glucose, UA: NEGATIVE mg/dL
Hgb urine dipstick: NEGATIVE
Ketones, ur: 5 mg/dL — AB
Leukocytes,Ua: NEGATIVE
Nitrite: NEGATIVE
Protein, ur: NEGATIVE mg/dL
Specific Gravity, Urine: 1.012 (ref 1.005–1.030)
pH: 6 (ref 5.0–8.0)

## 2020-02-24 LAB — COMPREHENSIVE METABOLIC PANEL
ALT: 22 U/L (ref 0–44)
AST: 37 U/L (ref 15–41)
Albumin: 3.6 g/dL (ref 3.5–5.0)
Alkaline Phosphatase: 69 U/L (ref 38–126)
Anion gap: 12 (ref 5–15)
BUN: 19 mg/dL (ref 8–23)
CO2: 23 mmol/L (ref 22–32)
Calcium: 8.7 mg/dL — ABNORMAL LOW (ref 8.9–10.3)
Chloride: 103 mmol/L (ref 98–111)
Creatinine, Ser: 1.09 mg/dL (ref 0.61–1.24)
GFR calc Af Amer: >60 mL/min (ref >60)
GFR calc non Af Amer: >60 mL/min (ref >60)
Glucose, Bld: 159 mg/dL — ABNORMAL HIGH (ref 70–99)
Potassium: 4 mmol/L (ref 3.5–5.1)
Sodium: 138 mmol/L (ref 135–145)
Total Bilirubin: 0.5 mg/dL (ref 0.3–1.2)
Total Protein: 6.7 g/dL (ref 6.5–8.1)

## 2020-02-24 LAB — CBC
HCT: 37.8 % — ABNORMAL LOW (ref 39.0–52.0)
Hemoglobin: 12.2 g/dL — ABNORMAL LOW (ref 13.0–17.0)
MCH: 29 pg (ref 26.0–34.0)
MCHC: 32.3 g/dL (ref 30.0–36.0)
MCV: 90 fL (ref 80.0–100.0)
Platelets: 322 10*3/uL (ref 150–400)
RBC: 4.2 MIL/uL — ABNORMAL LOW (ref 4.22–5.81)
RDW: 16.2 % — ABNORMAL HIGH (ref 11.5–15.5)
WBC: 6 10*3/uL (ref 4.0–10.5)
nRBC: 0 % (ref 0.0–0.2)

## 2020-02-24 LAB — LIPASE, BLOOD: Lipase: 20 U/L (ref 11–51)

## 2020-02-24 LAB — CK: Total CK: 572 U/L — ABNORMAL HIGH (ref 49–397)

## 2020-02-24 LAB — CBG MONITORING, ED
Glucose-Capillary: 176 mg/dL — ABNORMAL HIGH (ref 70–99)
Glucose-Capillary: 166 mg/dL — ABNORMAL HIGH (ref 70–99)

## 2020-02-24 LAB — LACTIC ACID, PLASMA
Lactic Acid, Venous: 2 mmol/L (ref 0.5–1.9)
Lactic Acid, Venous: 1.3 mmol/L (ref 0.5–1.9)

## 2020-02-24 MED ORDER — SODIUM CHLORIDE 0.9 % IV BOLUS
500.0000 mL | Freq: Once | INTRAVENOUS | Status: AC
  Administered 2020-02-24: 500 mL via INTRAVENOUS

## 2020-02-24 MED ORDER — SODIUM CHLORIDE (PF) 0.9 % IJ SOLN
INTRAMUSCULAR | Status: AC
  Filled 2020-02-24: qty 50

## 2020-02-24 MED ORDER — IOHEXOL 300 MG/ML  SOLN
100.0000 mL | Freq: Once | INTRAMUSCULAR | Status: AC | PRN
  Administered 2020-02-24: 100 mL via INTRAVENOUS
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
## 2020-02-24 NOTE — Discharge Instructions (Addendum)
Try to eat regular meals to improve your nutritional status.  Drink plenty of fluids.  Avoid using illegal substances.

## 2020-02-24 NOTE — ED Notes (Signed)
Pt given sandwich and sprite at this time.  

## 2020-02-24 NOTE — ED Notes (Signed)
Patient transported to CT 

## 2020-02-24 NOTE — ED Notes (Signed)
Date and time results received: 02/24/20 4:14 PM  (use smartphrase ".now" to insert current time)  Test: Lactic Acid Critical Value: 2.0  Name of Provider Notified: Dr. Effie Shy  Orders Received? Or Actions Taken?:

## 2020-02-24 NOTE — ED Provider Notes (Signed)
6:20 PM-patient is now asking to eat, so apparently his abdominal pain has resolved.  CT scan of the abdomen pelvis did not show any acute abnormalities.  Diet ordered.  8:02 PM-he has tolerated diet, including food and liquids.  He states he is ready to go back to his usual place where he stays outside.  He requests two blankets to use to pad the ground.  He states "I will be okay."  Patient suspected to have hypoglycemia, secondary to poor nutrition.  He is stable for discharge.     Mancel Bale, MD 02/24/20 2005

## 2020-02-29 ENCOUNTER — Encounter (HOSPITAL_COMMUNITY): Payer: Self-pay

## 2020-02-29 ENCOUNTER — Other Ambulatory Visit: Payer: Self-pay

## 2020-02-29 ENCOUNTER — Emergency Department (HOSPITAL_COMMUNITY)
Admission: EM | Admit: 2020-02-29 | Discharge: 2020-02-29 | Disposition: A | Discharge status: Home / Self Care | Payer: Medicare Other | Attending: Emergency Medicine | Admitting: Emergency Medicine

## 2020-02-29 DIAGNOSIS — R42 Dizziness and giddiness: Secondary | ICD-10-CM | POA: Insufficient documentation

## 2020-02-29 DIAGNOSIS — Z5321 Procedure and treatment not carried out due to patient leaving prior to being seen by health care provider: Secondary | ICD-10-CM | POA: Diagnosis not present

## 2020-02-29 HISTORY — DX: Dizziness and giddiness: R42

## 2020-02-29 LAB — CBC
HCT: 36.4 % — ABNORMAL LOW (ref 39.0–52.0)
Hemoglobin: 11.9 g/dL — ABNORMAL LOW (ref 13.0–17.0)
MCH: 29.7 pg (ref 26.0–34.0)
MCHC: 32.7 g/dL (ref 30.0–36.0)
MCV: 90.8 fL (ref 80.0–100.0)
Platelets: 262 10*3/uL (ref 150–400)
RBC: 4.01 MIL/uL — ABNORMAL LOW (ref 4.22–5.81)
RDW: 16.3 % — ABNORMAL HIGH (ref 11.5–15.5)
WBC: 8 10*3/uL (ref 4.0–10.5)
nRBC: 0 % (ref 0.0–0.2)

## 2020-02-29 LAB — BASIC METABOLIC PANEL
Anion gap: 6 (ref 5–15)
BUN: 11 mg/dL (ref 8–23)
CO2: 25 mmol/L (ref 22–32)
Calcium: 8.7 mg/dL — ABNORMAL LOW (ref 8.9–10.3)
Chloride: 111 mmol/L (ref 98–111)
Creatinine, Ser: 0.9 mg/dL (ref 0.61–1.24)
GFR calc Af Amer: >60 mL/min (ref >60)
GFR calc non Af Amer: >60 mL/min (ref >60)
Glucose, Bld: 112 mg/dL — ABNORMAL HIGH (ref 70–99)
Potassium: 3.9 mmol/L (ref 3.5–5.1)
Sodium: 142 mmol/L (ref 135–145)

## 2020-02-29 NOTE — ED Notes (Signed)
Did not answer

## 2020-02-29 NOTE — ED Notes (Signed)
Did not answer when called 

## 2020-02-29 NOTE — ED Triage Notes (Signed)
Per EMS- patient is homeless. Patient was picked up by an accounting business. Patient reports that he has been out in the heat x 2 days. Patient also states he has been trying to get into a shelter, but has had no luck.

## 2020-03-06 ENCOUNTER — Emergency Department (HOSPITAL_COMMUNITY): Payer: Medicare Other

## 2020-03-06 ENCOUNTER — Emergency Department (HOSPITAL_COMMUNITY)
Admission: EM | Admit: 2020-03-06 | Discharge: 2020-03-06 | Disposition: A | Discharge status: Home / Self Care | Payer: Medicare Other | Attending: Emergency Medicine | Admitting: Emergency Medicine

## 2020-03-06 ENCOUNTER — Other Ambulatory Visit: Payer: Self-pay

## 2020-03-06 DIAGNOSIS — I10 Essential (primary) hypertension: Secondary | ICD-10-CM | POA: Insufficient documentation

## 2020-03-06 DIAGNOSIS — R42 Dizziness and giddiness: Secondary | ICD-10-CM

## 2020-03-06 DIAGNOSIS — F17290 Nicotine dependence, other tobacco product, uncomplicated: Secondary | ICD-10-CM | POA: Insufficient documentation

## 2020-03-06 LAB — COMPREHENSIVE METABOLIC PANEL
ALT: 23 U/L (ref 0–44)
AST: 35 U/L (ref 15–41)
Albumin: 3.8 g/dL (ref 3.5–5.0)
Alkaline Phosphatase: 81 U/L (ref 38–126)
Anion gap: 7 (ref 5–15)
BUN: 12 mg/dL (ref 8–23)
CO2: 26 mmol/L (ref 22–32)
Calcium: 9 mg/dL (ref 8.9–10.3)
Chloride: 107 mmol/L (ref 98–111)
Creatinine, Ser: 0.77 mg/dL (ref 0.61–1.24)
GFR calc Af Amer: >60 mL/min (ref >60)
GFR calc non Af Amer: >60 mL/min (ref >60)
Glucose, Bld: 191 mg/dL — ABNORMAL HIGH (ref 70–99)
Potassium: 3.7 mmol/L (ref 3.5–5.1)
Sodium: 140 mmol/L (ref 135–145)
Total Bilirubin: 0.8 mg/dL (ref 0.3–1.2)
Total Protein: 6.8 g/dL (ref 6.5–8.1)

## 2020-03-06 LAB — CBC WITH DIFFERENTIAL/PLATELET
Abs Immature Granulocytes: 0.03 10*3/uL (ref 0.00–0.07)
Basophils Absolute: 0 10*3/uL (ref 0.0–0.1)
Basophils Relative: 0 %
Eosinophils Absolute: 0.2 10*3/uL (ref 0.0–0.5)
Eosinophils Relative: 3 %
HCT: 37 % — ABNORMAL LOW (ref 39.0–52.0)
Hemoglobin: 12.1 g/dL — ABNORMAL LOW (ref 13.0–17.0)
Immature Granulocytes: 0 %
Lymphocytes Relative: 21 %
Lymphs Abs: 1.5 10*3/uL (ref 0.7–4.0)
MCH: 29.5 pg (ref 26.0–34.0)
MCHC: 32.7 g/dL (ref 30.0–36.0)
MCV: 90.2 fL (ref 80.0–100.0)
Monocytes Absolute: 0.8 10*3/uL (ref 0.1–1.0)
Monocytes Relative: 12 %
Neutro Abs: 4.6 10*3/uL (ref 1.7–7.7)
Neutrophils Relative %: 64 %
Platelets: 293 10*3/uL (ref 150–400)
RBC: 4.1 MIL/uL — ABNORMAL LOW (ref 4.22–5.81)
RDW: 16.9 % — ABNORMAL HIGH (ref 11.5–15.5)
WBC: 7.2 10*3/uL (ref 4.0–10.5)
nRBC: 0 % (ref 0.0–0.2)

## 2020-03-06 MED ORDER — MECLIZINE HCL 25 MG PO TABS
25.0000 mg | ORAL_TABLET | Freq: Once | ORAL | Status: AC
  Administered 2020-03-06: 25 mg via ORAL
  Filled 2020-03-06: qty 1

## 2020-03-06 MED ORDER — DIPHENHYDRAMINE HCL 50 MG/ML IJ SOLN
25.0000 mg | Freq: Once | INTRAMUSCULAR | Status: AC
  Administered 2020-03-06: 25 mg via INTRAVENOUS
  Filled 2020-03-06: qty 1

## 2020-03-06 MED ORDER — METOCLOPRAMIDE HCL 5 MG/ML IJ SOLN
10.0000 mg | Freq: Once | INTRAMUSCULAR | Status: AC
  Administered 2020-03-06: 10 mg via INTRAVENOUS
  Filled 2020-03-06: qty 2

## 2020-03-06 MED ORDER — MECLIZINE HCL 25 MG PO TABS: 25.0000 mg | ORAL_TABLET | Freq: Three times a day (TID) | ORAL | 0 refills | Status: AC | PRN

## 2020-03-06 MED ORDER — SODIUM CHLORIDE 0.9 % IV BOLUS
1000.0000 mL | Freq: Once | INTRAVENOUS | Status: AC
  Administered 2020-03-06: 1000 mL via INTRAVENOUS

## 2020-03-06 NOTE — ED Triage Notes (Signed)
Patient bib [facility], was walking and developed vertigo per patient. VS per EMS 142/78, 76 hr, 16 resp, 97% RA

## 2020-03-06 NOTE — Discharge Instructions (Addendum)
Stay hydrated   Take meclizine for vertigo   See your doctor   See resources  You have previous compression fractures. Take tylenol, motrin for pain   Return to ER if you have worse dizziness, passing out.

## 2020-03-06 NOTE — Social Work (Signed)
CSW met with Pt at bedside.  CSW gave Pt homelessness resources in the form of # for [facility] as well as resources for free breakfast and lunch daily [employer]) Pt stated that he does not have an ID. CSW gave information about [facility] and their ID program.  Pt was getting ready to transfer to x-ray and was happy with resource options. CSW acknowledged Pts frustration.
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
## 2020-03-11 ENCOUNTER — Emergency Department (HOSPITAL_COMMUNITY): Payer: Medicare Other

## 2020-03-11 ENCOUNTER — Emergency Department (HOSPITAL_COMMUNITY)
Admission: EM | Admit: 2020-03-11 | Discharge: 2020-03-20 | Disposition: E | Payer: Medicare Other | Attending: Emergency Medicine | Admitting: Emergency Medicine

## 2020-03-11 DIAGNOSIS — S2241XA Multiple fractures of ribs, right side, initial encounter for closed fracture: Secondary | ICD-10-CM | POA: Insufficient documentation

## 2020-03-11 DIAGNOSIS — S72001A Fracture of unspecified part of neck of right femur, initial encounter for closed fracture: Secondary | ICD-10-CM | POA: Diagnosis not present

## 2020-03-11 DIAGNOSIS — Z9689 Presence of other specified functional implants: Secondary | ICD-10-CM

## 2020-03-11 DIAGNOSIS — Y9389 Activity, other specified: Secondary | ICD-10-CM | POA: Insufficient documentation

## 2020-03-11 DIAGNOSIS — Z9289 Personal history of other medical treatment: Secondary | ICD-10-CM

## 2020-03-11 DIAGNOSIS — S12101A Unspecified nondisplaced fracture of second cervical vertebra, initial encounter for closed fracture: Secondary | ICD-10-CM | POA: Insufficient documentation

## 2020-03-11 DIAGNOSIS — Y999 Unspecified external cause status: Secondary | ICD-10-CM | POA: Diagnosis not present

## 2020-03-11 DIAGNOSIS — T1490XA Injury, unspecified, initial encounter: Secondary | ICD-10-CM

## 2020-03-11 DIAGNOSIS — S8292XA Unspecified fracture of left lower leg, initial encounter for closed fracture: Secondary | ICD-10-CM | POA: Diagnosis not present

## 2020-03-11 DIAGNOSIS — S27322A Contusion of lung, bilateral, initial encounter: Secondary | ICD-10-CM | POA: Insufficient documentation

## 2020-03-11 DIAGNOSIS — S8291XA Unspecified fracture of right lower leg, initial encounter for closed fracture: Secondary | ICD-10-CM | POA: Diagnosis not present

## 2020-03-11 DIAGNOSIS — S270XXA Traumatic pneumothorax, initial encounter: Secondary | ICD-10-CM | POA: Insufficient documentation

## 2020-03-11 DIAGNOSIS — Y92410 Unspecified street and highway as the place of occurrence of the external cause: Secondary | ICD-10-CM | POA: Insufficient documentation

## 2020-03-11 DIAGNOSIS — I469 Cardiac arrest, cause unspecified: Secondary | ICD-10-CM | POA: Insufficient documentation

## 2020-03-11 DIAGNOSIS — S22080A Wedge compression fracture of T11-T12 vertebra, initial encounter for closed fracture: Secondary | ICD-10-CM | POA: Insufficient documentation

## 2020-03-11 DIAGNOSIS — R4182 Altered mental status, unspecified: Secondary | ICD-10-CM | POA: Insufficient documentation

## 2020-03-11 DIAGNOSIS — S020XXA Fracture of vault of skull, initial encounter for closed fracture: Secondary | ICD-10-CM | POA: Diagnosis not present

## 2020-03-11 DIAGNOSIS — Z20822 Contact with and (suspected) exposure to covid-19: Secondary | ICD-10-CM | POA: Insufficient documentation

## 2020-03-11 DIAGNOSIS — S32811A Multiple fractures of pelvis with unstable disruption of pelvic ring, initial encounter for closed fracture: Secondary | ICD-10-CM | POA: Diagnosis not present

## 2020-03-11 DIAGNOSIS — S0101XA Laceration without foreign body of scalp, initial encounter: Secondary | ICD-10-CM | POA: Diagnosis present

## 2020-03-11 DIAGNOSIS — R578 Other shock: Secondary | ICD-10-CM | POA: Insufficient documentation

## 2020-03-11 DIAGNOSIS — R58 Hemorrhage, not elsewhere classified: Secondary | ICD-10-CM

## 2020-03-11 LAB — CBC
HCT: 34.8 % — ABNORMAL LOW (ref 39.0–52.0)
Hemoglobin: 10.5 g/dL — ABNORMAL LOW (ref 13.0–17.0)
MCH: 29.5 pg (ref 26.0–34.0)
MCHC: 30.2 g/dL (ref 30.0–36.0)
MCV: 97.8 fL (ref 80.0–100.0)
Platelets: 259 10*3/uL (ref 150–400)
RBC: 3.56 MIL/uL — ABNORMAL LOW (ref 4.22–5.81)
RDW: 17.7 % — ABNORMAL HIGH (ref 11.5–15.5)
WBC: 9.5 10*3/uL (ref 4.0–10.5)
nRBC: 1 % — ABNORMAL HIGH (ref 0.0–0.2)

## 2020-03-11 LAB — PROTIME-INR
INR: 1.5 — ABNORMAL HIGH (ref 0.8–1.2)
Prothrombin Time: 17.5 seconds — ABNORMAL HIGH (ref 11.4–15.2)

## 2020-03-11 LAB — ABO/RH: ABO/RH(D): O POS

## 2020-03-11 MED ORDER — ETOMIDATE 2 MG/ML IV SOLN
INTRAVENOUS | Status: AC | PRN
  Administered 2020-03-11: 20 mg via INTRAVENOUS

## 2020-03-11 MED ORDER — ROCURONIUM BROMIDE 50 MG/5ML IV SOLN
INTRAVENOUS | Status: AC | PRN
  Administered 2020-03-11: 80 mg via INTRAVENOUS

## 2020-03-11 MED ORDER — IOHEXOL 300 MG/ML  SOLN
100.0000 mL | Freq: Once | INTRAMUSCULAR | Status: AC | PRN
  Administered 2020-03-11: 100 mL via INTRAVENOUS
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
## 2020-03-11 NOTE — ED Notes (Signed)
ET tube placed, color change noted.

## 2020-03-11 NOTE — ED Notes (Addendum)
Emergency RBC via [place] started
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
## 2020-03-12 ENCOUNTER — Emergency Department (HOSPITAL_COMMUNITY): Payer: Medicare Other

## 2020-03-12 DIAGNOSIS — I469 Cardiac arrest, cause unspecified: Secondary | ICD-10-CM | POA: Diagnosis not present

## 2020-03-12 LAB — PREPARE PLATELET PHERESIS
Unit division: 0
Unit division: 0
Unit division: 0

## 2020-03-12 LAB — BPAM PLATELET PHERESIS
Blood Product Expiration Date: 202107242359
Blood Product Expiration Date: 202107252359
Blood Product Expiration Date: 202107262359
ISSUE DATE / TIME: 202107232327
ISSUE DATE / TIME: 202107232350
ISSUE DATE / TIME: 202107240032
Unit Type and Rh: 6200
Unit Type and Rh: 6200
Unit Type and Rh: 6200

## 2020-03-12 LAB — PREPARE CRYOPRECIPITATE: Unit division: 0

## 2020-03-12 LAB — COMPREHENSIVE METABOLIC PANEL
ALT: 84 U/L — ABNORMAL HIGH (ref 0–44)
AST: 129 U/L — ABNORMAL HIGH (ref 15–41)
Albumin: 3.1 g/dL — ABNORMAL LOW (ref 3.5–5.0)
Alkaline Phosphatase: 115 U/L (ref 38–126)
Anion gap: 13 (ref 5–15)
BUN: 16 mg/dL (ref 8–23)
CO2: 22 mmol/L (ref 22–32)
Calcium: 8.5 mg/dL — ABNORMAL LOW (ref 8.9–10.3)
Chloride: 107 mmol/L (ref 98–111)
Creatinine, Ser: 1.36 mg/dL — ABNORMAL HIGH (ref 0.61–1.24)
GFR calc Af Amer: >60 mL/min (ref >60)
GFR calc non Af Amer: 53 mL/min — ABNORMAL LOW (ref >60)
Glucose, Bld: 185 mg/dL — ABNORMAL HIGH (ref 70–99)
Potassium: 3.9 mmol/L (ref 3.5–5.1)
Sodium: 142 mmol/L (ref 135–145)
Total Bilirubin: 0.7 mg/dL (ref 0.3–1.2)
Total Protein: 5.7 g/dL — ABNORMAL LOW (ref 6.5–8.1)

## 2020-03-12 LAB — SARS CORONAVIRUS 2 BY RT PCR (HOSPITAL ORDER, PERFORMED IN ~~LOC~~ HOSPITAL LAB): SARS Coronavirus 2: NEGATIVE

## 2020-03-12 LAB — BPAM CRYOPRECIPITATE
Blood Product Expiration Date: 202107240538
ISSUE DATE / TIME: 202107232352
Unit Type and Rh: 5100

## 2020-03-12 LAB — TRAUMA TEG PANEL
CFF Max Amplitude: 17.3 mm (ref 15–32)
Citrated Kaolin (R): 5.8 min (ref 4.6–9.1)
Citrated Rapid TEG (MA): 52.8 mm (ref 52–70)
Lysis at 30 Minutes: >22 % — ABNORMAL HIGH (ref 0.0–2.6)

## 2020-03-12 LAB — MASSIVE TRANSFUSION PROTOCOL ORDER (BLOOD BANK NOTIFICATION)

## 2020-03-12 LAB — LACTIC ACID, PLASMA: Lactic Acid, Venous: 6.5 mmol/L (ref 0.5–1.9)

## 2020-03-12 LAB — ETHANOL: Alcohol, Ethyl (B): <10 mg/dL (ref <10)

## 2020-03-12 MED ORDER — FENTANYL 2500MCG IN NS 250ML (10MCG/ML) PREMIX INFUSION
0.0000 ug/h | INTRAVENOUS | Status: DC
  Filled 2020-03-12: qty 250

## 2020-03-12 MED ORDER — EPINEPHRINE 1 MG/10ML IJ SOSY
PREFILLED_SYRINGE | INTRAMUSCULAR | Status: AC | PRN
  Administered 2020-03-12 (×2): 1 via INTRAVENOUS

## 2020-03-12 MED ORDER — CEFAZOLIN SODIUM-DEXTROSE 2-4 GM/100ML-% IV SOLN
2.0000 g | Freq: Once | INTRAVENOUS | Status: AC
  Administered 2020-03-12: 2 g via INTRAVENOUS
  Filled 2020-03-12: qty 100

## 2020-03-12 MED ORDER — TETANUS-DIPHTH-ACELL PERTUSSIS 5-2.5-18.5 LF-MCG/0.5 IM SUSP
0.5000 mL | Freq: Once | INTRAMUSCULAR | Status: DC
  Filled 2020-03-12: qty 0.5

## 2020-03-12 MED FILL — Medication: Qty: 1 | Status: AC

## 2020-03-13 LAB — BPAM RBC
Blood Product Expiration Date: 202108022359
Blood Product Expiration Date: 202108062359
Blood Product Expiration Date: 202108272359
Blood Product Expiration Date: 202108272359
Blood Product Expiration Date: 202108272359
Blood Product Expiration Date: 202108282359
Blood Product Expiration Date: 202108282359
Blood Product Expiration Date: 202108282359
Blood Product Expiration Date: 202108282359
Blood Product Expiration Date: 202108282359
Blood Product Expiration Date: 202108282359
Blood Product Expiration Date: 202108282359
Blood Product Expiration Date: 202108282359
Blood Product Expiration Date: 202108282359
Blood Product Expiration Date: 202108282359
Blood Product Expiration Date: 202108282359
Blood Product Expiration Date: 202108282359
Blood Product Expiration Date: 202108282359
Blood Product Expiration Date: 202108282359
Blood Product Expiration Date: 202108292359
Blood Product Expiration Date: 202108292359
Blood Product Expiration Date: 202108292359
Blood Product Expiration Date: 202108292359
Blood Product Expiration Date: 202108292359
Blood Product Expiration Date: 202108292359
Blood Product Expiration Date: 202108292359
Blood Product Expiration Date: 202108292359
Blood Product Expiration Date: 202108292359
ISSUE DATE / TIME: 202107232238
ISSUE DATE / TIME: 202107232244
ISSUE DATE / TIME: 202107232251
ISSUE DATE / TIME: 202107232251
ISSUE DATE / TIME: 202107232254
ISSUE DATE / TIME: 202107232254
ISSUE DATE / TIME: 202107232258
ISSUE DATE / TIME: 202107232258
ISSUE DATE / TIME: 202107232309
ISSUE DATE / TIME: 202107232309
ISSUE DATE / TIME: 202107232323
ISSUE DATE / TIME: 202107232323
ISSUE DATE / TIME: 202107232338
ISSUE DATE / TIME: 202107232338
ISSUE DATE / TIME: 202107232347
ISSUE DATE / TIME: 202107232347
ISSUE DATE / TIME: 202107232358
ISSUE DATE / TIME: 202107232358
ISSUE DATE / TIME: 202107240003
ISSUE DATE / TIME: 202107240003
ISSUE DATE / TIME: 202107240014
ISSUE DATE / TIME: 202107240014
ISSUE DATE / TIME: 202107240016
ISSUE DATE / TIME: 202107240016
ISSUE DATE / TIME: 202107240025
ISSUE DATE / TIME: 202107240025
ISSUE DATE / TIME: 202107240029
ISSUE DATE / TIME: 202107240029
Unit Type and Rh: 5100
Unit Type and Rh: 5100
Unit Type and Rh: 5100
Unit Type and Rh: 5100
Unit Type and Rh: 5100
Unit Type and Rh: 5100
Unit Type and Rh: 5100
Unit Type and Rh: 5100
Unit Type and Rh: 5100
Unit Type and Rh: 5100
Unit Type and Rh: 5100
Unit Type and Rh: 5100
Unit Type and Rh: 5100
Unit Type and Rh: 5100
Unit Type and Rh: 5100
Unit Type and Rh: 5100
Unit Type and Rh: 5100
Unit Type and Rh: 5100
Unit Type and Rh: 5100
Unit Type and Rh: 5100
Unit Type and Rh: 5100
Unit Type and Rh: 5100
Unit Type and Rh: 5100
Unit Type and Rh: 5100
Unit Type and Rh: 5100
Unit Type and Rh: 5100
Unit Type and Rh: 5100
Unit Type and Rh: 5100

## 2020-03-13 LAB — PREPARE FRESH FROZEN PLASMA
Unit division: 0
Unit division: 0
Unit division: 0
Unit division: 0
Unit division: 0
Unit division: 0
Unit division: 0
Unit division: 0
Unit division: 0
Unit division: 0
Unit division: 0
Unit division: 0
Unit division: 0
Unit division: 0
Unit division: 0
Unit division: 0
Unit division: 0
Unit division: 0

## 2020-03-13 LAB — TYPE AND SCREEN
ABO/RH(D): O POS
Antibody Screen: NEGATIVE
Unit division: 0
Unit division: 0
Unit division: 0
Unit division: 0
Unit division: 0
Unit division: 0
Unit division: 0
Unit division: 0
Unit division: 0
Unit division: 0
Unit division: 0
Unit division: 0
Unit division: 0
Unit division: 0
Unit division: 0
Unit division: 0
Unit division: 0
Unit division: 0
Unit division: 0
Unit division: 0
Unit division: 0
Unit division: 0
Unit division: 0
Unit division: 0
Unit division: 0
Unit division: 0
Unit division: 0
Unit division: 0

## 2020-03-13 LAB — BPAM FFP
Blood Product Expiration Date: 202107272359
Blood Product Expiration Date: 202107272359
Blood Product Expiration Date: 202107272359
Blood Product Expiration Date: 202107272359
Blood Product Expiration Date: 202107272359
Blood Product Expiration Date: 202107272359
Blood Product Expiration Date: 202107272359
Blood Product Expiration Date: 202107272359
Blood Product Expiration Date: 202107282359
Blood Product Expiration Date: 202107282359
Blood Product Expiration Date: 202107282359
Blood Product Expiration Date: 202107282359
Blood Product Expiration Date: 202107282359
Blood Product Expiration Date: 202107282359
Blood Product Expiration Date: 202107282359
Blood Product Expiration Date: 202107282359
Blood Product Expiration Date: 202107282359
Blood Product Expiration Date: 202107282359
Blood Product Expiration Date: 202107282359
Blood Product Expiration Date: 202107292359
Blood Product Expiration Date: 202107312359
Blood Product Expiration Date: 202108012359
Blood Product Expiration Date: 202108042359
Blood Product Expiration Date: 202108112359
Blood Product Expiration Date: 202108112359
Blood Product Expiration Date: 202108112359
Blood Product Expiration Date: 202108112359
ISSUE DATE / TIME: 202107232245
ISSUE DATE / TIME: 202107232245
ISSUE DATE / TIME: 202107232251
ISSUE DATE / TIME: 202107232251
ISSUE DATE / TIME: 202107232254
ISSUE DATE / TIME: 202107232254
ISSUE DATE / TIME: 202107232305
ISSUE DATE / TIME: 202107232305
ISSUE DATE / TIME: 202107232308
ISSUE DATE / TIME: 202107232308
ISSUE DATE / TIME: 202107232324
ISSUE DATE / TIME: 202107232324
ISSUE DATE / TIME: 202107232339
ISSUE DATE / TIME: 202107232339
ISSUE DATE / TIME: 202107232348
ISSUE DATE / TIME: 202107232348
ISSUE DATE / TIME: 202107232359
ISSUE DATE / TIME: 202107232359
ISSUE DATE / TIME: 202107240004
ISSUE DATE / TIME: 202107240004
ISSUE DATE / TIME: 202107240012
ISSUE DATE / TIME: 202107240012
ISSUE DATE / TIME: 202107240019
ISSUE DATE / TIME: 202107240019
ISSUE DATE / TIME: 202107240027
ISSUE DATE / TIME: 202107240027
ISSUE DATE / TIME: 202107240036
Unit Type and Rh: 5100
Unit Type and Rh: 5100
Unit Type and Rh: 5100
Unit Type and Rh: 5100
Unit Type and Rh: 5100
Unit Type and Rh: 5100
Unit Type and Rh: 5100
Unit Type and Rh: 600
Unit Type and Rh: 600
Unit Type and Rh: 600
Unit Type and Rh: 6200
Unit Type and Rh: 6200
Unit Type and Rh: 6200
Unit Type and Rh: 6200
Unit Type and Rh: 6200
Unit Type and Rh: 6200
Unit Type and Rh: 6200
Unit Type and Rh: 6200
Unit Type and Rh: 6200
Unit Type and Rh: 6200
Unit Type and Rh: 6200
Unit Type and Rh: 6200
Unit Type and Rh: 6200
Unit Type and Rh: 6200
Unit Type and Rh: 6200
Unit Type and Rh: 7300
Unit Type and Rh: 7300

## 2020-03-20 LAB — CDS SEROLOGY

## 2020-03-20 NOTE — Procedures (Signed)
Chest Tube Insertion Procedure Note  Indications:  Clinically significant Pneumothorax  Pre-operative Diagnosis: Pneumothorax and Hemothorax  Post-operative Diagnosis: Pneumothorax and Hemothorax  Procedure Details  Procedure performed under duress- emergency consent and fired.  After sterile skin prep, using standard Seldinger technique, a 14 French pigtail tube was placed in the right lateral 5th rib space.  Findings: Rush of air, small volume hemothorax

## 2020-03-20 NOTE — ED Notes (Signed)
Pulse check - no pulse Restarted CPR

## 2020-03-20 NOTE — ED Notes (Signed)
Delay to CT d/t patient instability. Pt to be stabilized first per MD. MTP ongoing, BP systolic 50s at this time.

## 2020-03-20 NOTE — ED Notes (Signed)
TDAP given.

## 2020-03-20 NOTE — Procedures (Signed)
Central Venous Catheter Insertion Procedure Note  MEGAN HAYDUK  [id number]  28-Jun-1951  Date:03/05/2020  Time:12:49 AM   Provider Performing:Mir Fullilove A Fredricka Bonine   Procedure: Insertion of Non-tunneled Central Venous Catheter(36556) without US guidance  Indication(s) Difficult access  Consent Unable to obtain consent due to emergent nature of procedure.  Anesthesia none  Sterile Technique Maximal sterile technique was not able to be achieved due to emergent nature of procedure.  Procedure Description Area of catheter insertion was cleaned with chlorhexidine and draped in sterile fashion.  Without real-time ultrasound guidance a central venous catheter was placed into the left subclavian vein. Nonpulsatile blood flow and easy flushing noted in all ports.  The catheter was sutured in place and sterile dressing applied.  Complications/Tolerance No complication of procedure  EBL Minimal  Specimen(s) None

## 2020-03-20 NOTE — ED Notes (Signed)
Pulse check -   Provider using ultra sound to look at heart -   Time of death 50

## 2020-03-20 NOTE — ED Notes (Signed)
Lost pulses, started CPR [phone]

## 2020-03-20 DEATH — deceased
